# Patient Record
Sex: Female | Born: 1965 | Race: White | Hispanic: No | Marital: Married | Smoking: Former smoker
Health system: Southern US, Community
[De-identification: ages and names within clinical notes are randomized; demographics above are authoritative.]

## PROBLEM LIST (undated history)

## (undated) DIAGNOSIS — R112 Nausea with vomiting, unspecified: Secondary | ICD-10-CM

## (undated) DIAGNOSIS — M1990 Unspecified osteoarthritis, unspecified site: Secondary | ICD-10-CM

## (undated) DIAGNOSIS — Z9889 Other specified postprocedural states: Secondary | ICD-10-CM

## (undated) DIAGNOSIS — F419 Anxiety disorder, unspecified: Secondary | ICD-10-CM

## (undated) DIAGNOSIS — S83207A Unspecified tear of unspecified meniscus, current injury, left knee, initial encounter: Secondary | ICD-10-CM

## (undated) DIAGNOSIS — N3281 Overactive bladder: Secondary | ICD-10-CM

## (undated) DIAGNOSIS — J302 Other seasonal allergic rhinitis: Secondary | ICD-10-CM

## (undated) DIAGNOSIS — Z973 Presence of spectacles and contact lenses: Secondary | ICD-10-CM

## (undated) DIAGNOSIS — E119 Type 2 diabetes mellitus without complications: Secondary | ICD-10-CM

---

## 1987-09-21 HISTORY — PX: TUBAL LIGATION: SHX77

## 1999-09-21 HISTORY — PX: LAPAROSCOPIC CHOLECYSTECTOMY: SUR755

## 2001-09-20 HISTORY — PX: APPENDECTOMY: SHX54

## 2003-09-21 HISTORY — PX: OTHER SURGICAL HISTORY: SHX169

## 2007-09-21 HISTORY — PX: VAGINAL HYSTERECTOMY: SUR661

## 2007-09-21 HISTORY — PX: LAPAROSCOPIC SALPINGOOPHERECTOMY: SUR795

## 2008-09-20 HISTORY — PX: CARDIAC CATHETERIZATION: SHX172

## 2009-02-12 ENCOUNTER — Encounter: Payer: Self-pay | Admitting: Emergency Medicine

## 2009-02-13 ENCOUNTER — Inpatient Hospital Stay (HOSPITAL_COMMUNITY): Admission: AD | Admit: 2009-02-13 | Discharge: 2009-02-14 | Payer: Self-pay | Admitting: Cardiology

## 2010-09-20 HISTORY — PX: ANTERIOR CERVICAL DECOMP/DISCECTOMY FUSION: SHX1161

## 2010-12-29 LAB — POCT CARDIAC MARKERS
Myoglobin, poc: 34 ng/mL (ref 12–200)
Troponin i, poc: <0.05 ng/mL (ref 0.00–0.09)
Troponin i, poc: <0.05 ng/mL (ref 0.00–0.09)

## 2010-12-29 LAB — URINE MICROSCOPIC-ADD ON

## 2010-12-29 LAB — CBC
HCT: 35.4 % — ABNORMAL LOW (ref 36.0–46.0)
Hemoglobin: 11.9 g/dL — ABNORMAL LOW (ref 12.0–15.0)
MCHC: 34.1 g/dL (ref 30.0–36.0)
MCV: 91.8 fL (ref 78.0–100.0)
Platelets: 248 10*3/uL (ref 150–400)
RBC: 3.85 MIL/uL — ABNORMAL LOW (ref 3.87–5.11)
RDW: 12.4 % (ref 11.5–15.5)
WBC: 6.3 10*3/uL (ref 4.0–10.5)
WBC: 6.5 10*3/uL (ref 4.0–10.5)

## 2010-12-29 LAB — URINALYSIS, ROUTINE W REFLEX MICROSCOPIC
Bilirubin Urine: NEGATIVE
Protein, ur: NEGATIVE mg/dL
Urobilinogen, UA: 0.2 mg/dL (ref 0.0–1.0)

## 2010-12-29 LAB — COMPREHENSIVE METABOLIC PANEL
ALT: 25 U/L (ref 0–35)
Albumin: 4 g/dL (ref 3.5–5.2)
Alkaline Phosphatase: 71 U/L (ref 39–117)
Chloride: 102 mEq/L (ref 96–112)
Potassium: 3.4 mEq/L — ABNORMAL LOW (ref 3.5–5.1)
Sodium: 137 mEq/L (ref 135–145)
Total Bilirubin: 0.6 mg/dL (ref 0.3–1.2)
Total Protein: 7.4 g/dL (ref 6.0–8.3)

## 2010-12-29 LAB — BASIC METABOLIC PANEL
BUN: 10 mg/dL (ref 6–23)
CO2: 29 mEq/L (ref 19–32)
Calcium: 8.9 mg/dL (ref 8.4–10.5)
Chloride: 106 mEq/L (ref 96–112)
Creatinine, Ser: 0.74 mg/dL (ref 0.4–1.2)
Creatinine, Ser: 0.85 mg/dL (ref 0.4–1.2)
GFR calc Af Amer: >60 mL/min (ref >60)
GFR calc Af Amer: >60 mL/min (ref >60)
GFR calc non Af Amer: >60 mL/min (ref >60)
Potassium: 3.8 mEq/L (ref 3.5–5.1)
Sodium: 139 mEq/L (ref 135–145)

## 2010-12-29 LAB — DIFFERENTIAL
Basophils Absolute: 0 10*3/uL (ref 0.0–0.1)
Basophils Relative: 1 % (ref 0–1)
Eosinophils Absolute: 0.2 10*3/uL (ref 0.0–0.7)
Eosinophils Relative: 3 % (ref 0–5)
Monocytes Absolute: 0.5 10*3/uL (ref 0.1–1.0)
Monocytes Relative: 8 % (ref 3–12)

## 2010-12-29 LAB — LIPID PANEL
HDL: 38 mg/dL — ABNORMAL LOW (ref >39)
LDL Cholesterol: 121 mg/dL — ABNORMAL HIGH (ref 0–99)
Triglycerides: 67 mg/dL (ref <150)
VLDL: 13 mg/dL (ref 0–40)

## 2010-12-29 LAB — URINE CULTURE: Colony Count: NO GROWTH

## 2010-12-29 LAB — CARDIAC PANEL(CRET KIN+CKTOT+MB+TROPI)
Relative Index: INVALID (ref 0.0–2.5)
Total CK: 59 U/L (ref 7–177)
Troponin I: <0.01 ng/mL (ref 0.00–0.06)

## 2011-02-02 NOTE — Cardiovascular Report (Signed)
NAMEANNABELLA, ELFORD                 ACCOUNT NO.:  [id number]   MEDICAL RECORD NO.:  [id number]          PATIENT TYPE:  INP   LOCATION:  2505                         FACILITY:  [facility]   PHYSICIAN:  Cristy Hilts. Jacinto Halim, MD       DATE OF BIRTH:  10/28/1965   DATE OF PROCEDURE:  02/13/2009  DATE OF DISCHARGE:                            CARDIAC CATHETERIZATION   PROCEDURE PERFORMED:  1. Left ventriculography.  2. Selective intraoperative coronary arteriography.  3. Ascending aortogram.   INDICATIONS:  Ms. Kamela Blansett is a 45 year old female who has diet-  controlled diabetes, hyperlipidemia, family history as well as previous  coronary artery disease, who was admitted via [facility] with  chest pain with radiation to her neck.  She had recurrence of this chest  discomfort.  Given her multiple cardiovascular risks and ongoing chest  discomfort with subtle EKG abnormalities with nonspecific T-wave  inversion in the inferior leads, she was directly brought to the cardiac  catheterization lab to evaluate her coronary anatomy.   HEMODYNAMIC DATA:  The left ventricular pressure was 117/5 with end-  diastolic pressure of 10 mmHg.  Her aortic pressure was 119/72 with a  mean of 94 mmHg.  There was no pressure gradient across the aortic  valve.   ANGIOGRAPHIC DATA:  Left ventricle:  Left ventricular systolic function  was normal with the ejection fraction of 60%.  There was no regional  wall motion abnormality.   Right coronary artery:  Right coronary artery is a caliber vessel and a  dominant vessel giving origin to large PDA and a smaller PDA branch and  a moderate-sized PLA branch, smooth and normal.   LAD:  LAD is a large-caliber vessel in the proximal segment giving  origin to 2 large diagonals.  It ends at the apex.  It is smooth and  normal.   Circumflex coronary artery:  Circumflex coronary artery is a moderate-  caliber vessel which is smooth and normal.   Ascending aortogram:   Ascending aortogram revealed a presence of 3  aortic valve cusps without evidence of aortic dissection or aortic  regurgitation.   IMPRESSION:  Normal coronary arteries, normal left ventricle, and normal  left ventricular hemodynamics.  No evidence of aortic dissection or  ascending aortic aneurysm.  Abdominal aorta was also screened, but was  not documented.  It appeared to be normal.  No evidence of renal artery  stenosis.   RECOMMENDATIONS:  Evaluation for noncardiac cause of chest pain is  indicated.  We will probably need to treat her for GERD and esophageal  spasm.  Given her multiple cardiovascular risks , she should be treated  with statins to get the LDL less than 100 preferably closer to 70.  She  does need weight loss and lifestyle of modification.   TECHNIQUE OF THE PROCEDURE:  Under usual sterile precautions using a 6-  French right femoral arterial access, 6-French multipurpose B2 catheter  was used and advanced into the ascending aorta and then to the left  ventricle.  Left ventriculography was performed both in LAO and RAO  projection.  Catheter pulled into the  ascending aorta.  Right coronary artery was selectively engaged and  angiography was performed.  Then, left coronary artery was selectively  engaged and angiography was performed.  Catheter was then pulled into  root of the aorta and left ascending aortogram was performed in the LAO  projection.      Cristy Hilts. Jacinto Halim, MD  Electronically Signed     JRG/MEDQ  D:  02/13/2009  T:  02/14/2009  Job:  [id number]   cc:   [facility]

## 2011-02-02 NOTE — H&P (Signed)
NAMEGRAYCIE, HALLEY NO.:  [id number]   MEDICAL RECORD NO.:  [id number]          PATIENT TYPE:  INP   LOCATION:  1429                         FACILITY:  [facility]   PHYSICIAN:  Vania Rea, M.D. DATE OF BIRTH:  [birth year]   DATE OF ADMISSION:  02/12/2009  DATE OF DISCHARGE:                              HISTORY & PHYSICAL   PRIMARY CARE PHYSICIAN:  Unassigned.   CHIEF COMPLAINT:  Chest pain.   HISTORY OF PRESENT ILLNESS:  This is a 45 year old obese Caucasian lady  who works as a IT consultant, who was at work on the afternoon of admission  when she suddenly felt cold and sweaty, then developed a heavy pressure  in her chest.  She said she felt as if there was a cinder block on her  chest.  She rates the discomfort as a 4/10, and it has radiated up into  her neck.  Patient took no medication and felt that it would ease if she  simply rested.  It lasted for about half an hour, and eventually she  came to [facility]  to be evaluated for the chest pain.   Patient has no known personal cardiac risk factors, but her father had  an acute MI at age 61, and she has many uncles and aunts on his side  with cardiac disease.  None of her siblings in her age group have heart  disease.  She does not smoke nor drink.  She does not know her lipid  status.  Patient denies any dyspnea on exertion, lower extremity edema,  or orthopnea.  She exercises off and on.  The last time she exercised  was about 2 weeks ago.  She walked 2 miles.  She said she had no chest  discomfort with that exertion.   PAST MEDICAL HISTORY:  1. Chronic back pain.  2. Seasonal allergies.   PAST SURGICAL HISTORY:  She is status post appendectomy,  cholecystectomy, hysterectomy, and tubal ligation.   MEDICATIONS:  1. Ibuprofen 800 mg 3 times daily.  2. Ibuprofen with acetaminophen 5/325 1/2 to 1 tablet 3 times daily.  3. Flonase nasal spray as needed.  4. Allegra-D 1 tablet daily.  5.  Estrogen patches 0.0375 twice weekly.   ALLERGIES:  No known drug allergies.   SOCIAL HISTORY:  Denies tobacco, alcohol, or illicit drug use.  Works as  a IT consultant.   FAMILY HISTORY:  As noted above.   REVIEW OF SYSTEMS:  On a 10-point review of systems, other than noted  above, unremarkable.   PHYSICAL EXAMINATION:  A pleasant middle-aged Caucasian lady lying on  the stretcher in no acute distress.  VITALS:  Temperature 97.9, pulse 70, respirations 18, blood pressure  117/62.  She is saturating at 100% on room air.  Pupils are round and equal.  Mucous membranes are pink and anicteric.  She is not dehydrated.  No cervical lymphadenopathy or thyromegaly.  No  carotid bruit.  No jugular venous distention.  CHEST:  Clear to auscultation bilaterally.  CARDIOVASCULAR:  Regular rhythm without murmurs, rubs or gallops.  ABDOMEN:  Obese and soft.  EXTREMITIES:  Without edema.  She has 3+ dorsalis pedis and posterior  tibial pulses bilaterally.  CENTRAL NERVOUS SYSTEM:  Cranial nerves II-XII are grossly intact.  She  has no focal neurological deficits.   LABS:  CBC is reviewed.  White count 6.5, hemoglobin 11.9, platelets  248.  She has otherwise a normal differential.  Serum chemistry is  significant for a potassium of 3.4, BUN 12, creatinine 0.77.  All other  liver functions and chemistry are completely normal.  Her coags are  completely normal.  Point-of-care markers are completely normal with  undetectable troponins and a CK-MB.  Urinalysis showed concentrated  urine with a specific gravity of 1.036, otherwise unremarkable.  Her  urine microscopy showed no white cells, no red cells, rare bacteria.   Portable chest x-ray showed no acute abnormalities.   Her EKG showed normal sinus rhythm with reported left axis deviation and  incomplete right bundle branch block and a long Q-T.   ASSESSMENT:  Significant chest pressure in a middle-aged lady with a  strong family history of  coronary artery disease.   PLAN:  Will bring this lady on observation to check serial cardiac  enzymes and check her lipid status.  Check her thyroid function.  Patient was somewhat dehydrated with low serum potassium.  She denies  diuretic use.  She has a long Q-T.  We will hydrate her and replace her  potassium.  She is anemic and has a history of significant NSAIDs use.  This could be peptic ulcer disease.  We will check her anemia panel and  withhold NSAIDs for the time being.  Other plans as per orders.      Vania Rea, M.D.  Electronically Signed     LC/MEDQ  D:  02/13/2009  T:  02/13/2009  Job:  [id number]

## 2011-02-02 NOTE — Discharge Summary (Signed)
Anna [name], Anna [name]                 ACCOUNT NO.:  [id number]   MEDICAL RECORD NO.:  [id number]          PATIENT TYPE:  INP   LOCATION:  2505                         FACILITY:  [facility]   PHYSICIAN:  Cristy Hilts. Jacinto Halim, MD       DATE OF BIRTH:  06/07/1966   DATE OF ADMISSION:  02/13/2009  DATE OF DISCHARGE:  02/14/2009                               DISCHARGE SUMMARY   DISCHARGE DIAGNOSES:  1. Chest pain worrisome for unstable angina, catheterization this      admission revealing normal coronaries and left ventricular      function.  2. Strong family history of coronary artery disease.  3. Type 2 non-insulin-dependent diabetes, diet controlled.  4. Dyslipidemia.   HOSPITAL COURSE:  The patient is a 45 year old female with no prior  history of coronary artery disease who was admitted to [facility] on Feb 12, 2009 with substernal chest pain described as  pressure associated with some mild diaphoresis.  She was admitted to the  hospitalist service.  We were asked to see her in consult on Feb 13, 2009.  The patient does have risk factors for coronary artery disease  including diet-controlled diabetes had a strong family history of  coronary artery disease, her father had an MI at 81.  Symptoms were  concerning for unstable angina.  She was seen in consult by Dr. Jacinto Halim  and it was decided to proceed with diagnostic catheterization.  This was  done on Feb 13, 2009, and revealed normal coronary arteries and LV  function.  We feel the patient could be discharged and follow up with  Dr. Jacinto Halim.  We did suggest an over-the-counter PPI therapy.   DISCHARGE MEDICATIONS:  1. Ibuprofen 800 mg t.i.d. p.r.n.  2. Oxycodone 5/325 one half tablet two to three times a day p.r.n.  3. Flonase spray p.r.n.  4. Allegra-D p.r.n.  5. Estrogen patch 0.0375 twice a week.  6. Prilosec OTC 20 mg a day.   LABS:  White count 6.3, hemoglobin 12.1, hematocrit 35.4, platelets 223.  Sodium 140, potassium 3.8,  BUN 10, creatinine 0.85.  CK-MB and troponins  were negative.  TSH is 3.19, cholesterol is 172, HDL 38, LDL 121.  Urinalysis did show some hematuria.  Urine culture showed no growth.  Liver functions were normal.  EKG shows sinus rhythm without acute  changes.  Chest x-ray shows no acute abnormalities.   DISPOSITION:  The patient was discharged in stable condition and will  follow up with Dr. Jacinto Halim in a couple of weeks.      Abelino Derrick, P.A.      Cristy Hilts. Jacinto Halim, MD  Electronically Signed    LKK/MEDQ  D:  02/14/2009  T:  02/15/2009  Job:  [id number]

## 2013-02-24 DIAGNOSIS — F339 Major depressive disorder, recurrent, unspecified: Secondary | ICD-10-CM | POA: Insufficient documentation

## 2013-11-23 DIAGNOSIS — M5124 Other intervertebral disc displacement, thoracic region: Secondary | ICD-10-CM | POA: Insufficient documentation

## 2013-11-23 DIAGNOSIS — M5106 Intervertebral disc disorders with myelopathy, lumbar region: Secondary | ICD-10-CM | POA: Insufficient documentation

## 2013-11-23 DIAGNOSIS — G4733 Obstructive sleep apnea (adult) (pediatric): Secondary | ICD-10-CM | POA: Insufficient documentation

## 2013-11-23 DIAGNOSIS — M51379 Other intervertebral disc degeneration, lumbosacral region without mention of lumbar back pain or lower extremity pain: Secondary | ICD-10-CM | POA: Insufficient documentation

## 2014-05-10 DIAGNOSIS — N3941 Urge incontinence: Secondary | ICD-10-CM | POA: Insufficient documentation

## 2014-05-10 DIAGNOSIS — N905 Atrophy of vulva: Secondary | ICD-10-CM | POA: Insufficient documentation

## 2014-05-10 DIAGNOSIS — J309 Allergic rhinitis, unspecified: Secondary | ICD-10-CM | POA: Insufficient documentation

## 2014-05-10 DIAGNOSIS — K580 Irritable bowel syndrome with diarrhea: Secondary | ICD-10-CM | POA: Insufficient documentation

## 2014-05-10 DIAGNOSIS — R3129 Other microscopic hematuria: Secondary | ICD-10-CM | POA: Insufficient documentation

## 2014-05-10 DIAGNOSIS — K219 Gastro-esophageal reflux disease without esophagitis: Secondary | ICD-10-CM | POA: Insufficient documentation

## 2014-08-20 HISTORY — PX: KNEE ARTHROSCOPY: SUR90

## 2015-07-01 ENCOUNTER — Ambulatory Visit: Payer: Self-pay | Admitting: Orthopedic Surgery

## 2015-07-01 NOTE — Progress Notes (Signed)
Preoperative surgical orders have been place into the Epic hospital system for Anna [name] on 07/01/2015, 12:25 PM  by Patrica Duel for surgery on 07-16-2015.  Preop Knee Scope orders including IV Tylenol and IV Decadron as long as there are no contraindications to the above medications. Avel Peace, PA-C

## 2015-07-10 ENCOUNTER — Encounter (HOSPITAL_BASED_OUTPATIENT_CLINIC_OR_DEPARTMENT_OTHER): Payer: Self-pay | Admitting: *Deleted

## 2015-07-10 NOTE — Progress Notes (Signed)
NPO AFTER MN.  ARRIVE AT 0700.  NEEDS HG.  

## 2015-07-15 DIAGNOSIS — S83249A Other tear of medial meniscus, current injury, unspecified knee, initial encounter: Secondary | ICD-10-CM | POA: Diagnosis present

## 2015-07-15 NOTE — H&P (Signed)
  CC- Anna [name] is a 49 y.o. female who presents with left knee pain.  HPI- . Knee Pain: Patient presents with knee pain involving the  left knee. Onset of the symptoms was several months ago. Inciting event: none known. Current symptoms include giving out, pain located medially and stiffness. Pain is aggravated by lateral movements, pivoting, rising after sitting, squatting and walking.  Patient has had prior knee problems. Evaluation to date: MRI: abnormal recurrent medial meniscus tear. Treatment to date: corticosteroid injection which was not very effective.  Past Medical History  Diagnosis Date  . Acute meniscal tear of left knee   . Seasonal allergies   . OAB (overactive bladder)   . Arthritis     NECK , KNEE  . Wears glasses   . PONV (postoperative nausea and vomiting)     Past Surgical History  Procedure Laterality Date  . Laparoscopic cholecystectomy  2001  . Tubal ligation  1989  . Appendectomy  2003  . Ligament repair left thumb  2005  . Vaginal hysterectomy  2009  . Laparoscopic salpingoopherectomy  2009  . Anterior cervical decomp/discectomy fusion  2012    C4 -- 5  . Knee arthroscopy Left Dec 2015    Prior to Admission medications   Medication Sig Start Date End Date Taking? Authorizing Provider  conjugated estrogens (PREMARIN) vaginal cream Apply 1 Applicatorful topically daily. ON ARM   Yes Historical Provider, MD  Estradiol (VAGIFEM) 10 MCG TABS vaginal tablet Place vaginally daily.   Yes Historical Provider, MD  fexofenadine-pseudoephedrine (ALLEGRA-D 24) 180-240 MG 24 hr tablet Take 1 tablet by mouth daily as needed.   Yes Historical Provider, MD  fluticasone (FLONASE) 50 MCG/ACT nasal spray Place into both nostrils as needed for allergies or rhinitis.   Yes Historical Provider, MD  Ibuprofen (ADVIL) 200 MG CAPS Take 600 mg by mouth 3 (three) times daily.   Yes Historical Provider, MD  Meth-Hyo-M Bl-Na Phos-Ph Sal (URIBEL) 118 MG CAPS Take by mouth as needed.    Yes Historical Provider, MD  mirabegron ER (MYRBETRIQ) 25 MG TB24 tablet Take 25 mg by mouth every evening.   Yes Historical Provider, MD  olopatadine (PATANOL) 0.1 % ophthalmic solution Place 1 drop into both eyes as needed for allergies.   Yes Historical Provider, MD  phentermine (ADIPEX-P) 37.5 MG tablet Take 37.5 mg by mouth daily before breakfast.   Yes Historical Provider, MD  terbinafine (LAMISIL) 250 MG tablet Take 250 mg by mouth every evening.   Yes Historical Provider, MD   KNEE EXAM antalgic gait, soft tissue tenderness over medial joint line, effusion, negative drawer sign, collateral ligaments intact  Physical Examination: General appearance - alert, well appearing, and in no distress Mental status - alert, oriented to person, place, and time Chest - clear to auscultation, no wheezes, rales or rhonchi, symmetric air entry Heart - normal rate, regular rhythm, normal S1, S2, no murmurs, rubs, clicks or gallops Abdomen - soft, nontender, nondistended, no masses or organomegaly Neurological - alert, oriented, normal speech, no focal findings or movement disorder noted    Asessment/Plan--- Left knee recurrent medial meniscal tear- - Plan left knee arthroscopy with meniscal debridement. Procedure risks and potential comps discussed with patient who elects to proceed. Goals are decreased pain and increased function with a high likelihood of achieving both

## 2015-07-16 ENCOUNTER — Encounter (HOSPITAL_BASED_OUTPATIENT_CLINIC_OR_DEPARTMENT_OTHER): Payer: Self-pay | Admitting: *Deleted

## 2015-07-16 ENCOUNTER — Ambulatory Visit (HOSPITAL_BASED_OUTPATIENT_CLINIC_OR_DEPARTMENT_OTHER): Payer: Federal, State, Local not specified - PPO | Admitting: Anesthesiology

## 2015-07-16 ENCOUNTER — Ambulatory Visit (HOSPITAL_BASED_OUTPATIENT_CLINIC_OR_DEPARTMENT_OTHER)
Admission: RE | Admit: 2015-07-16 | Discharge: 2015-07-16 | Disposition: A | Discharge status: Home / Self Care | Payer: Federal, State, Local not specified - PPO | Source: Ambulatory Visit | Attending: Orthopedic Surgery | Admitting: Orthopedic Surgery

## 2015-07-16 ENCOUNTER — Encounter (HOSPITAL_BASED_OUTPATIENT_CLINIC_OR_DEPARTMENT_OTHER)
Admission: RE | Disposition: A | Discharge status: Home / Self Care | Payer: Self-pay | Source: Ambulatory Visit | Attending: Orthopedic Surgery

## 2015-07-16 DIAGNOSIS — Z79890 Hormone replacement therapy (postmenopausal): Secondary | ICD-10-CM | POA: Diagnosis not present

## 2015-07-16 DIAGNOSIS — M94262 Chondromalacia, left knee: Secondary | ICD-10-CM | POA: Diagnosis not present

## 2015-07-16 DIAGNOSIS — M1990 Unspecified osteoarthritis, unspecified site: Secondary | ICD-10-CM | POA: Diagnosis not present

## 2015-07-16 DIAGNOSIS — S83242A Other tear of medial meniscus, current injury, left knee, initial encounter: Secondary | ICD-10-CM | POA: Insufficient documentation

## 2015-07-16 DIAGNOSIS — Z79899 Other long term (current) drug therapy: Secondary | ICD-10-CM | POA: Diagnosis not present

## 2015-07-16 DIAGNOSIS — G709 Myoneural disorder, unspecified: Secondary | ICD-10-CM | POA: Diagnosis not present

## 2015-07-16 DIAGNOSIS — Z791 Long term (current) use of non-steroidal anti-inflammatories (NSAID): Secondary | ICD-10-CM | POA: Insufficient documentation

## 2015-07-16 DIAGNOSIS — X58XXXA Exposure to other specified factors, initial encounter: Secondary | ICD-10-CM | POA: Insufficient documentation

## 2015-07-16 DIAGNOSIS — Z7951 Long term (current) use of inhaled steroids: Secondary | ICD-10-CM | POA: Insufficient documentation

## 2015-07-16 DIAGNOSIS — Z87891 Personal history of nicotine dependence: Secondary | ICD-10-CM | POA: Insufficient documentation

## 2015-07-16 DIAGNOSIS — M25562 Pain in left knee: Secondary | ICD-10-CM | POA: Diagnosis present

## 2015-07-16 DIAGNOSIS — S83249A Other tear of medial meniscus, current injury, unspecified knee, initial encounter: Secondary | ICD-10-CM | POA: Diagnosis present

## 2015-07-16 HISTORY — DX: Other specified postprocedural states: R11.2

## 2015-07-16 HISTORY — DX: Unspecified tear of unspecified meniscus, current injury, left knee, initial encounter: S83.207A

## 2015-07-16 HISTORY — DX: Overactive bladder: N32.81

## 2015-07-16 HISTORY — DX: Presence of spectacles and contact lenses: Z97.3

## 2015-07-16 HISTORY — DX: Unspecified osteoarthritis, unspecified site: M19.90

## 2015-07-16 HISTORY — DX: Other specified postprocedural states: Z98.890

## 2015-07-16 HISTORY — DX: Other seasonal allergic rhinitis: J30.2

## 2015-07-16 HISTORY — PX: KNEE ARTHROSCOPY: SHX127

## 2015-07-16 HISTORY — DX: Nausea with vomiting, unspecified: R11.2

## 2015-07-16 LAB — POCT HEMOGLOBIN-HEMACUE: HEMOGLOBIN: 12.9 g/dL (ref 12.0–15.0)

## 2015-07-16 SURGERY — ARTHROSCOPY, KNEE
Anesthesia: General | Site: Knee | Laterality: Left

## 2015-07-16 MED ORDER — ONDANSETRON HCL 4 MG/2ML IJ SOLN
INTRAMUSCULAR | Status: AC
  Filled 2015-07-16: qty 2

## 2015-07-16 MED ORDER — FENTANYL CITRATE (PF) 100 MCG/2ML IJ SOLN
INTRAMUSCULAR | Status: AC
  Filled 2015-07-16: qty 2

## 2015-07-16 MED ORDER — MIDAZOLAM HCL 5 MG/5ML IJ SOLN
INTRAMUSCULAR | Status: DC | PRN
  Administered 2015-07-16: 2 mg via INTRAVENOUS
  Administered 2015-07-16 (×2): 1 mg via INTRAVENOUS

## 2015-07-16 MED ORDER — SCOPOLAMINE 1 MG/3DAYS TD PT72
MEDICATED_PATCH | TRANSDERMAL | Status: AC
  Filled 2015-07-16: qty 1

## 2015-07-16 MED ORDER — SODIUM CHLORIDE 0.9 % IR SOLN
Status: DC | PRN
  Administered 2015-07-16: 6000 mL

## 2015-07-16 MED ORDER — SCOPOLAMINE 1 MG/3DAYS TD PT72
MEDICATED_PATCH | TRANSDERMAL | Status: DC | PRN
  Administered 2015-07-16: 1 via TRANSDERMAL

## 2015-07-16 MED ORDER — SODIUM CHLORIDE 0.9 % IV SOLN
INTRAVENOUS | Status: DC
  Filled 2015-07-16: qty 1000

## 2015-07-16 MED ORDER — MIDAZOLAM HCL 2 MG/2ML IJ SOLN
INTRAMUSCULAR | Status: AC
  Filled 2015-07-16: qty 2

## 2015-07-16 MED ORDER — KETOROLAC TROMETHAMINE 30 MG/ML IJ SOLN
INTRAMUSCULAR | Status: DC | PRN
  Administered 2015-07-16: 30 mg via INTRAVENOUS

## 2015-07-16 MED ORDER — FENTANYL CITRATE (PF) 100 MCG/2ML IJ SOLN
25.0000 ug | INTRAMUSCULAR | Status: DC | PRN
  Administered 2015-07-16: 25 ug via INTRAVENOUS
  Administered 2015-07-16: 50 ug via INTRAVENOUS
  Filled 2015-07-16: qty 1

## 2015-07-16 MED ORDER — PROPOFOL 10 MG/ML IV BOLUS
INTRAVENOUS | Status: DC | PRN
Start: 2015-07-16 — End: 2015-07-16
  Administered 2015-07-16: 50 mg via INTRAVENOUS
  Administered 2015-07-16: 200 mg via INTRAVENOUS
  Administered 2015-07-16: 50 mg via INTRAVENOUS

## 2015-07-16 MED ORDER — PROMETHAZINE HCL 25 MG/ML IJ SOLN
6.2500 mg | INTRAMUSCULAR | Status: DC | PRN
Start: 2015-07-16 — End: 2015-07-16
  Filled 2015-07-16: qty 1

## 2015-07-16 MED ORDER — ACETAMINOPHEN 10 MG/ML IV SOLN
1000.0000 mg | Freq: Once | INTRAVENOUS | Status: AC
  Administered 2015-07-16: 1000 mg via INTRAVENOUS
  Filled 2015-07-16: qty 100

## 2015-07-16 MED ORDER — PROPOFOL 500 MG/50ML IV EMUL
INTRAVENOUS | Status: DC | PRN
  Administered 2015-07-16: 200 ug/kg/min via INTRAVENOUS

## 2015-07-16 MED ORDER — LIDOCAINE HCL (CARDIAC) 20 MG/ML IV SOLN
INTRAVENOUS | Status: DC | PRN
  Administered 2015-07-16: 100 mg via INTRAVENOUS

## 2015-07-16 MED ORDER — OXYCODONE HCL 5 MG PO TABS: 5.0000 mg | ORAL_TABLET | ORAL | Status: DC | PRN

## 2015-07-16 MED ORDER — DEXAMETHASONE SODIUM PHOSPHATE 10 MG/ML IJ SOLN
10.0000 mg | Freq: Once | INTRAMUSCULAR | Status: AC
  Administered 2015-07-16: 10 mg via INTRAVENOUS
  Filled 2015-07-16: qty 1

## 2015-07-16 MED ORDER — CEFAZOLIN SODIUM-DEXTROSE 2-3 GM-% IV SOLR
INTRAVENOUS | Status: AC
  Filled 2015-07-16: qty 50

## 2015-07-16 MED ORDER — METHOCARBAMOL 500 MG PO TABS: 500.0000 mg | ORAL_TABLET | Freq: Four times a day (QID) | ORAL | Status: DC

## 2015-07-16 MED ORDER — METHOCARBAMOL 500 MG PO TABS
500.0000 mg | ORAL_TABLET | Freq: Four times a day (QID) | ORAL | Status: DC | PRN
  Administered 2015-07-16: 500 mg via ORAL
  Filled 2015-07-16: qty 1

## 2015-07-16 MED ORDER — LACTATED RINGERS IV SOLN
INTRAVENOUS | Status: DC
  Administered 2015-07-16: 08:00:00 via INTRAVENOUS
  Filled 2015-07-16: qty 1000

## 2015-07-16 MED ORDER — METHOCARBAMOL 500 MG PO TABS
ORAL_TABLET | ORAL | Status: AC
  Filled 2015-07-16: qty 1

## 2015-07-16 MED ORDER — OXYCODONE HCL 5 MG PO TABS
ORAL_TABLET | ORAL | Status: AC
  Filled 2015-07-16: qty 1

## 2015-07-16 MED ORDER — OXYCODONE HCL 5 MG PO TABS
5.0000 mg | ORAL_TABLET | ORAL | Status: DC | PRN
Start: 2015-07-16 — End: 2015-07-16
  Administered 2015-07-16: 5 mg via ORAL
  Filled 2015-07-16: qty 1

## 2015-07-16 MED ORDER — FENTANYL CITRATE (PF) 100 MCG/2ML IJ SOLN
INTRAMUSCULAR | Status: DC | PRN
  Administered 2015-07-16 (×4): 50 ug via INTRAVENOUS

## 2015-07-16 MED ORDER — BUPIVACAINE-EPINEPHRINE 0.25% -1:200000 IJ SOLN
INTRAMUSCULAR | Status: DC | PRN
  Administered 2015-07-16: 20 mL

## 2015-07-16 MED ORDER — CHLORHEXIDINE GLUCONATE 4 % EX LIQD
60.0000 mL | Freq: Once | CUTANEOUS | Status: DC
  Filled 2015-07-16: qty 60

## 2015-07-16 MED ORDER — CEFAZOLIN SODIUM-DEXTROSE 2-3 GM-% IV SOLR
2.0000 g | INTRAVENOUS | Status: AC
  Administered 2015-07-16: 2 g via INTRAVENOUS
  Filled 2015-07-16: qty 50

## 2015-07-16 MED ORDER — HYDROCODONE-ACETAMINOPHEN 5-325 MG PO TABS: 1.0000 | ORAL_TABLET | Freq: Four times a day (QID) | ORAL | Status: DC | PRN

## 2015-07-16 MED ORDER — FENTANYL CITRATE (PF) 100 MCG/2ML IJ SOLN
INTRAMUSCULAR | Status: AC
  Filled 2015-07-16: qty 4

## 2015-07-16 MED ORDER — ONDANSETRON HCL 4 MG/2ML IJ SOLN
INTRAMUSCULAR | Status: DC | PRN
  Administered 2015-07-16: 4 mg via INTRAVENOUS

## 2015-07-16 SURGICAL SUPPLY — 45 items
BANDAGE ELASTIC 6 VELCRO ST LF (GAUZE/BANDAGES/DRESSINGS) ×2 IMPLANT
BLADE 4.2CUDA (BLADE) ×2 IMPLANT
BLADE CUDA SHAVER 3.5 (BLADE) IMPLANT
BLADE CUTTER GATOR 3.5 (BLADE) IMPLANT
CANISTER SUCT LVC 12 LTR MEDI- (MISCELLANEOUS) ×2 IMPLANT
CANISTER SUCTION 2500CC (MISCELLANEOUS) IMPLANT
CLOTH BEACON ORANGE TIMEOUT ST (SAFETY) ×2 IMPLANT
CUFF TOURN SGL QUICK 34 (TOURNIQUET CUFF) ×1
CUFF TRNQT CYL 34X4X40X1 (TOURNIQUET CUFF) ×1 IMPLANT
DRAPE ARTHROSCOPY W/POUCH 114 (DRAPES) ×2 IMPLANT
DRAPE U-SHAPE 47X51 STRL (DRAPES) ×2 IMPLANT
DRSG EMULSION OIL 3X3 NADH (GAUZE/BANDAGES/DRESSINGS) ×2 IMPLANT
DRSG PAD ABDOMINAL 8X10 ST (GAUZE/BANDAGES/DRESSINGS) ×2 IMPLANT
DURAPREP 26ML APPLICATOR (WOUND CARE) ×2 IMPLANT
ELECT MENISCUS 165MM 90D (ELECTRODE) IMPLANT
ELECT REM PT RETURN 9FT ADLT (ELECTROSURGICAL)
ELECTRODE REM PT RTRN 9FT ADLT (ELECTROSURGICAL) IMPLANT
GLOVE BIO SURGEON STRL SZ 6 (GLOVE) ×4 IMPLANT
GLOVE BIO SURGEON STRL SZ7.5 (GLOVE) ×2 IMPLANT
GLOVE BIO SURGEON STRL SZ8 (GLOVE) ×2 IMPLANT
GLOVE INDICATOR 8.0 STRL GRN (GLOVE) ×2 IMPLANT
GOWN STRL REUS W/ TWL LRG LVL3 (GOWN DISPOSABLE) ×2 IMPLANT
GOWN STRL REUS W/TWL LRG LVL3 (GOWN DISPOSABLE) ×2
IV NS IRRIG 3000ML ARTHROMATIC (IV SOLUTION) ×6 IMPLANT
KIT ROOM TURNOVER WOR (KITS) ×2 IMPLANT
KNEE WRAP E Z 3 GEL PACK (MISCELLANEOUS) ×2 IMPLANT
MANIFOLD NEPTUNE II (INSTRUMENTS) IMPLANT
PACK ARTHROSCOPY DSU (CUSTOM PROCEDURE TRAY) ×2 IMPLANT
PACK BASIN DAY SURGERY FS (CUSTOM PROCEDURE TRAY) ×2 IMPLANT
PADDING CAST ABS 4INX4YD NS (CAST SUPPLIES) ×1
PADDING CAST ABS 6INX4YD NS (CAST SUPPLIES) ×1
PADDING CAST ABS COTTON 4X4 ST (CAST SUPPLIES) ×1 IMPLANT
PADDING CAST ABS COTTON 6X4 NS (CAST SUPPLIES) ×1 IMPLANT
PADDING CAST COTTON 6X4 STRL (CAST SUPPLIES) ×2 IMPLANT
PENCIL BUTTON HOLSTER BLD 10FT (ELECTRODE) IMPLANT
SET ARTHROSCOPY TUBING (MISCELLANEOUS) ×1
SET ARTHROSCOPY TUBING LN (MISCELLANEOUS) ×1 IMPLANT
SPONGE GAUZE 4X4 12PLY (GAUZE/BANDAGES/DRESSINGS) ×2 IMPLANT
SPONGE GAUZE 4X4 12PLY STER LF (GAUZE/BANDAGES/DRESSINGS) ×2 IMPLANT
SUT ETHILON 4 0 PS 2 18 (SUTURE) ×2 IMPLANT
TOWEL OR 17X24 6PK STRL BLUE (TOWEL DISPOSABLE) ×2 IMPLANT
TUBE CONNECTING 12X1/4 (SUCTIONS) IMPLANT
WAND 30 DEG SABER W/CORD (SURGICAL WAND) IMPLANT
WAND 90 DEG TURBOVAC W/CORD (SURGICAL WAND) ×2 IMPLANT
WATER STERILE IRR 500ML POUR (IV SOLUTION) ×2 IMPLANT

## 2015-07-16 NOTE — Transfer of Care (Signed)
Last Vitals:  Filed Vitals:   07/16/15 0727  BP: 124/79  Pulse: 92  Temp: 36.9 C  Resp: 18    Immediate Anesthesia Transfer of Care Note  Patient: Anna [name]  Procedure(s) Performed: Procedure(s) (LRB): ARTHROSCOPY LEFT KNEE WITH MEDIAL MENISCAL DEBRIDEMENT (Left)  Patient Location: PACU  Anesthesia Type: General  Level of Consciousness: awake, alert  and oriented  Airway & Oxygen Therapy: Patient Spontanous Breathing and Patient connected to face mask oxygen  Post-op Assessment: Report given to PACU RN and Post -op Vital signs reviewed and stable  Post vital signs: Reviewed and stable  Complications: No apparent anesthesia complications

## 2015-07-16 NOTE — Anesthesia Procedure Notes (Signed)
Procedure Name: LMA Insertion Date/Time: 07/16/2015 8:53 AM Performed by: Norva PavlovALLAWAY, Jamiere Gulas G Pre-anesthesia Checklist: Patient identified, Emergency Drugs available, Suction available and Patient being monitored Patient Re-evaluated:Patient Re-evaluated prior to inductionOxygen Delivery Method: Circle System Utilized Preoxygenation: Pre-oxygenation with 100% oxygen Intubation Type: IV induction Ventilation: Mask ventilation without difficulty LMA: LMA inserted LMA Size: 4.0 Number of attempts: 1 Airway Equipment and Method: bite block Placement Confirmation: positive ETCO2 Tube secured with: Tape Dental Injury: Teeth and Oropharynx as per pre-operative assessment

## 2015-07-16 NOTE — Anesthesia Postprocedure Evaluation (Signed)
  Anesthesia Post-op Note  Patient: Anna [name]  Procedure(s) Performed: Procedure(s) (LRB): ARTHROSCOPY LEFT KNEE WITH MEDIAL MENISCAL DEBRIDEMENT (Left)  Patient Location: PACU  Anesthesia Type: General with TIVA  Level of Consciousness: awake and alert   Airway and Oxygen Therapy: Patient Spontanous Breathing  Post-op Pain: mild  Post-op Assessment: Post-op Vital signs reviewed, Patient's Cardiovascular Status Stable, Respiratory Function Stable, Patent Airway and No signs of Nausea or vomiting  Last Vitals:  Filed Vitals:   07/16/15 1045  BP: 105/56  Pulse: 71  Temp:   Resp: 14    Post-op Vital Signs: stable   Complications: No apparent anesthesia complications

## 2015-07-16 NOTE — Interval H&P Note (Signed)
History and Physical Interval Note:  07/16/2015 8:38 AM  Anna [name]  has presented today for surgery, with the diagnosis of left knee medial menical tear  The various methods of treatment have been discussed with the patient and family. After consideration of risks, benefits and other options for treatment, the patient has consented to  Procedure(s): ARTHROSCOPY LEFT KNEE WITH DEBRIDEMENT (Left) as a surgical intervention .  The patient's history has been reviewed, patient examined, no change in status, stable for surgery.  I have reviewed the patient's chart and labs.  Questions were answered to the patient's satisfaction.     Loanne DrillingALUISIO,Jameela Michna V

## 2015-07-16 NOTE — Anesthesia Preprocedure Evaluation (Signed)
Anesthesia Evaluation  Patient identified by MRN, date of birth, ID band Patient awake    Reviewed: Allergy & Precautions, NPO status , Patient's Chart, lab work & pertinent test results  History of Anesthesia Complications (+) PONV and history of anesthetic complications  Airway Mallampati: II  TM Distance: >3 FB Neck ROM: Full    Dental no notable dental hx.    Pulmonary former smoker,    Pulmonary exam normal breath sounds clear to auscultation       Cardiovascular negative cardio ROS Normal cardiovascular exam Rhythm:Regular Rate:Normal     Neuro/Psych  Neuromuscular disease negative psych ROS   GI/Hepatic negative GI ROS, Neg liver ROS,   Endo/Other  negative endocrine ROS  Renal/GU negative Renal ROS  negative genitourinary   Musculoskeletal  (+) Arthritis ,   Abdominal   Peds negative pediatric ROS (+)  Hematology negative hematology ROS (+)   Anesthesia Other Findings   Reproductive/Obstetrics negative OB ROS                             Anesthesia Physical Anesthesia Plan  ASA: II  Anesthesia Plan: General   Post-op Pain Management:    Induction: Intravenous  Airway Management Planned: LMA  Additional Equipment:   Intra-op Plan:   Post-operative Plan: Extubation in OR  Informed Consent: I have reviewed the patients History and Physical, chart, labs and discussed the procedure including the risks, benefits and alternatives for the proposed anesthesia with the patient or authorized representative who has indicated his/her understanding and acceptance.   Dental advisory given  Plan Discussed with: CRNA  Anesthesia Plan Comments:         Anesthesia Quick Evaluation

## 2015-07-16 NOTE — Op Note (Signed)
Preoperative diagnosis-  Left knee medial meniscal tear  Postoperative diagnosis Left- knee medial meniscal tear   Procedure- Left knee arthroscopy with medial  meniscal debridement    Surgeon- Gus RankinFrank V. Gentry Seeber, MD  Anesthesia-General  EBL-  Minimal  Complications- None  Condition- PACU - hemodynamically stable.  Brief clinical note- -Anna [name] is a 49 y.o.  female with a several month history of left knee pain and mechanical symptoms. Exam and history suggested recurrent medial meniscal tear confirmed by MRI. The patient presents now for arthroscopy and debridement   Procedure in detail -       After successful administration of General anesthetic, a tourmiquet is placed high on the Left  thigh and the Left lower extremity is prepped and draped in the usual sterile fashion. Time out is performed by the surgical team. Standard superomedial and inferolateral portal sites are marked and incisions made with an 11 blade. The inflow cannula is passed through the superomedial portal and camera through the inferolateral portal and inflow is initiated. Arthroscopic visualization proceeds.      The undersurface of the patella and trochlea are visualized and normal. The medial and lateral gutters are visualized and there are  no loose bodies. Flexion and valgus force is applied to the knee and the medial compartment is entered. A spinal needle is passed into the joint through the site marked for the inferomedial portal. A small incision is made and the dilator passed into the joint. The findings for the medial compartment are tear with small flap at junction of body and posterior horn of medial meniscus.There is mild chondromalacia but no unstable chondral defects . The tear is debrided to a stable base with baskets and a shaver and sealed off with the Arthrocare. It is probed and found to be stable.    The intercondylar notch is visualized and the ACL appears normal. The lateral compartment is  entered and the findings are normal .     The joint is again inspected and there are no other tears, defects or loose bodies identified. The arthroscopic equipment is then removed from the inferior portals which are closed with interrupted 4-0 nylon. 20 ml of .25% Marcaine with epinephrine are injected through the inflow cannula and the cannula is then removed and the portal closed with nylon. The incisions are cleaned and dried and a bulky sterile dressing is applied. The patient is then awakened and transported to recovery in stable condition.   07/16/2015, 9:30 AM

## 2015-07-16 NOTE — Discharge Instructions (Signed)
° °Dr. Frank Aluisio °Total Joint Specialist °[facility] °[address] °[place], [place] [address] °[phone] ° ° °Arthroscopic Procedure, Knee °An arthroscopic procedure can find what is wrong with your knee. °PROCEDURE °Arthroscopy is a surgical technique that allows your orthopedic surgeon to diagnose and treat your knee injury with accuracy. They will look into your knee through a small instrument. This is almost like a small (pencil sized) telescope. Because arthroscopy affects your knee less than open knee surgery, you can anticipate a more rapid recovery. Taking an active role by following your caregiver's instructions will help with rapid and complete recovery. Use crutches, rest, elevation, ice, and knee exercises as instructed. The length of recovery depends on various factors including type of injury, age, physical condition, medical conditions, and your rehabilitation. °Your knee is the joint between the large bones (femur and tibia) in your leg. Cartilage covers these bone ends which are smooth and slippery and allow your knee to bend and move smoothly. Two menisci, thick, semi-lunar shaped pads of cartilage which form a rim inside the joint, help absorb shock and stabilize your knee. Ligaments bind the bones together and support your knee joint. Muscles move the joint, help support your knee, and take stress off the joint itself. Because of this all programs and physical therapy to rehabilitate an injured or repaired knee require rebuilding and strengthening your muscles. °AFTER THE PROCEDURE °· After the procedure, you will be moved to a recovery area until most of the effects of the medication have worn off. Your caregiver will discuss the test results with you.  °· Only take over-the-counter or prescription medicines for pain, discomfort, or fever as directed by your caregiver.  °SEEK MEDICAL CARE IF:  °· You have increased bleeding from your wounds.  °· You see  redness, swelling, or have increasing pain in your wounds.  °· You have pus coming from your wound.  °· You have an oral temperature above 102° F (38.9° C).  °· You notice a bad smell coming from the wound or dressing.  °· You have severe pain with any motion of your knee.  °SEEK IMMEDIATE MEDICAL CARE IF:  °· You develop a rash.  °· You have difficulty breathing.  °· You have any allergic problems.  °FURTHER INSTRUCTIONS:  °· ICE to the affected knee every three hours for 30 minutes at a time and then as needed for pain and swelling.  Continue to use ice on the knee for pain and swelling from surgery. You may notice swelling that will progress down to the foot and ankle.  This is normal after surgery.  Elevate the leg when you are not up walking on it.   ° °DIET °You may resume your previous home diet once your are discharged from the hospital. ° °DRESSING / WOUND CARE / SHOWERING °You may start showering two days after being discharged home but do not submerge the incisions under water.  °Change dressing 48 hours after the procedure and then cover the small incisions with band aids until your follow up visit. °Change the surgical dressings daily and reapply a dry dressing each time.  ° °ACTIVITY °Walk with your walker as instructed. °Use walker as long as suggested by your caregivers. °Avoid periods of inactivity such as sitting longer than an hour when not asleep. This helps prevent blood clots.  °You may resume a sexual relationship in one month or when given the OK by your doctor.  °You may return to   work once you are cleared by your doctor.  °Do not drive a car for 6 weeks or until released by you surgeon.  °Do not drive while taking narcotics. ° °WEIGHT BEARING AS TOLERATED ° °POSTOPERATIVE CONSTIPATION PROTOCOL °Constipation - defined medically as fewer than three stools per week and severe constipation as less than one stool per week. ° °One of the most common issues patients have following surgery is  constipation.  Even if you have a regular bowel pattern at home, your normal regimen is likely to be disrupted due to multiple reasons following surgery.  Combination of anesthesia, postoperative narcotics, change in appetite and fluid intake all can affect your bowels.  In order to avoid complications following surgery, here are some recommendations in order to help you during your recovery period. ° °Colace (docusate) - Pick up an over-the-counter form of Colace or another stool softener and take twice a day as long as you are requiring postoperative pain medications.  Take with a full glass of water daily.  If you experience loose stools or diarrhea, hold the colace until you stool forms back up.  If your symptoms do not get better within 1 week or if they get worse, check with your doctor. ° °Dulcolax (bisacodyl) - Pick up over-the-counter and take as directed by the product packaging as needed to assist with the movement of your bowels.  Take with a full glass of water.  Use this product as needed if not relieved by Colace only.  ° °MiraLax (polyethylene glycol) - Pick up over-the-counter to have on hand.  MiraLax is a solution that will increase the amount of water in your bowels to assist with bowel movements.  Take as directed and can mix with a glass of water, juice, soda, coffee, or tea.  Take if you go more than two days without a movement. °Do not use MiraLax more than once per day. Call your doctor if you are still constipated or irregular after using this medication for 7 days in a row. ° °If you continue to have problems with postoperative constipation, please contact the office for further assistance and recommendations.  If you experience "the worst abdominal pain ever" or develop nausea or vomiting, please contact the office immediatly for further recommendations for treatment. ° °ITCHING ° If you experience itching with your medications, try taking only a single pain pill, or even half a pain pill  at a time.  You can also use Benadryl over the counter for itching or also to help with sleep.  ° °TED HOSE STOCKINGS °Wear the elastic stockings on both legs for three weeks following surgery during the day but you may remove then at night for sleeping. ° °MEDICATIONS °See your medication summary on the “After Visit Summary” that the nursing staff will review with you prior to discharge.  You may have some home medications which will be placed on hold until you complete the course of blood thinner medication.  It is important for you to complete the blood thinner medication as prescribed by your surgeon.  Continue your approved medications as instructed at time of discharge. °Do not drive while taking narcotics.  ° °PRECAUTIONS °If you experience chest pain or shortness of breath - call 911 immediately for transfer to the hospital emergency department.  °If you develop a fever greater that 101 F, purulent drainage from wound, increased redness or drainage from wound, foul odor from the wound/dressing, or calf pain - CONTACT YOUR SURGEON.   °                                                °  FOLLOW-UP APPOINTMENTS °Make sure you keep all of your appointments after your operation with your surgeon and caregivers. You should call the office at [phone]  and make an appointment for approximately one week after the date of your surgery or on the date instructed by your surgeon outlined in the "After Visit Summary". ° °RANGE OF MOTION AND STRENGTHENING EXERCISES  °Rehabilitation of the knee is important following a knee injury or an operation. After just a few days of immobilization, the muscles of the thigh which control the knee become weakened and shrink (atrophy). Knee exercises are designed to build up the tone and strength of the thigh muscles and to improve knee motion. Often times heat used for twenty to thirty minutes before working out will loosen up your tissues and help with improving the range of motion  but do not use heat for the first two weeks following surgery. These exercises can be done on a training (exercise) mat, on the floor, on a table or on a bed. Use what ever works the best and is most comfortable for you Knee exercises include: ° °QUAD STRENGTHENING EXERCISES °Strengthening Quadriceps Sets ° °Tighten muscles on top of thigh by pushing knees down into floor or table. °Hold for 20 seconds. Repeat 10 times. °Do 2 sessions per day. ° ° ° ° °Strengthening Terminal Knee Extension ° °With knee bent over bolster, straighten knee by tightening muscle on top of thigh. Be sure to keep bottom of knee on bolster. °Hold for 20 seconds. Repeat 10 times. °Do 2 sessions per day. ° ° °Straight Leg with Bent Knee ° °Lie on back with opposite leg bent. Keep involved knee slightly bent at knee and raise leg 4-6". Hold for 10 seconds. °Repeat 20 times per set. °Do 2 sets per session. °Do 2 sessions per day. ° ° °Post Anesthesia Home Care Instructions ° °Activity: °Get plenty of rest for the remainder of the day. A responsible adult should stay with you for 24 hours following the procedure.  °For the next 24 hours, DO NOT: °-Drive a car °-Operate machinery °-Drink alcoholic beverages °-Take any medication unless instructed by your physician °-Make any legal decisions or sign important papers. ° °Meals: °Start with liquid foods such as gelatin or soup. Progress to regular foods as tolerated. Avoid greasy, spicy, heavy foods. If nausea and/or vomiting occur, drink only clear liquids until the nausea and/or vomiting subsides. Call your physician if vomiting continues. ° °Special Instructions/Symptoms: °Your throat may feel dry or sore from the anesthesia or the breathing tube placed in your throat during surgery. If this causes discomfort, gargle with warm salt water. The discomfort should disappear within 24 hours. ° °If you had a scopolamine patch placed behind your ear for the management of post- operative nausea and/or  vomiting: ° °1. The medication in the patch is effective for 72 hours, after which it should be removed.  Wrap patch in a tissue and discard in the trash. Wash hands thoroughly with soap and water. °2. You may remove the patch earlier than 72 hours if you experience unpleasant side effects which may include dry mouth, dizziness or visual disturbances. °3. Avoid touching the patch. Wash your hands with soap and water after contact with the patch. °  ° °

## 2015-07-17 ENCOUNTER — Encounter (HOSPITAL_BASED_OUTPATIENT_CLINIC_OR_DEPARTMENT_OTHER): Payer: Self-pay | Admitting: Orthopedic Surgery

## 2016-05-06 DIAGNOSIS — B351 Tinea unguium: Secondary | ICD-10-CM | POA: Insufficient documentation

## 2018-07-17 ENCOUNTER — Other Ambulatory Visit: Payer: Self-pay | Admitting: Obstetrics and Gynecology

## 2018-07-17 DIAGNOSIS — R928 Other abnormal and inconclusive findings on diagnostic imaging of breast: Secondary | ICD-10-CM

## 2018-07-20 ENCOUNTER — Ambulatory Visit
Admission: RE | Admit: 2018-07-20 | Discharge: 2018-07-20 | Disposition: A | Discharge status: Home / Self Care | Payer: Federal, State, Local not specified - PPO | Source: Ambulatory Visit | Attending: Obstetrics and Gynecology | Admitting: Obstetrics and Gynecology

## 2018-07-20 DIAGNOSIS — R928 Other abnormal and inconclusive findings on diagnostic imaging of breast: Secondary | ICD-10-CM

## 2018-10-23 DIAGNOSIS — G5622 Lesion of ulnar nerve, left upper limb: Secondary | ICD-10-CM | POA: Insufficient documentation

## 2018-10-23 DIAGNOSIS — Z981 Arthrodesis status: Secondary | ICD-10-CM | POA: Insufficient documentation

## 2019-06-13 IMAGING — MG DIGITAL DIAGNOSTIC UNILATERAL LEFT MAMMOGRAM WITH TOMO AND CAD
6 series · 6 of 18 positions shown · non-contrast
Comparison: Previous exam(s).

CLINICAL DATA: 52-year-old female for further evaluation of
possible LEFT breast mass on screening mammogram

EXAM:
DIGITAL DIAGNOSTIC LEFT MAMMOGRAM WITH CAD AND TOMO
ULTRASOUND LEFT BREAST

[L ML synth-2D]
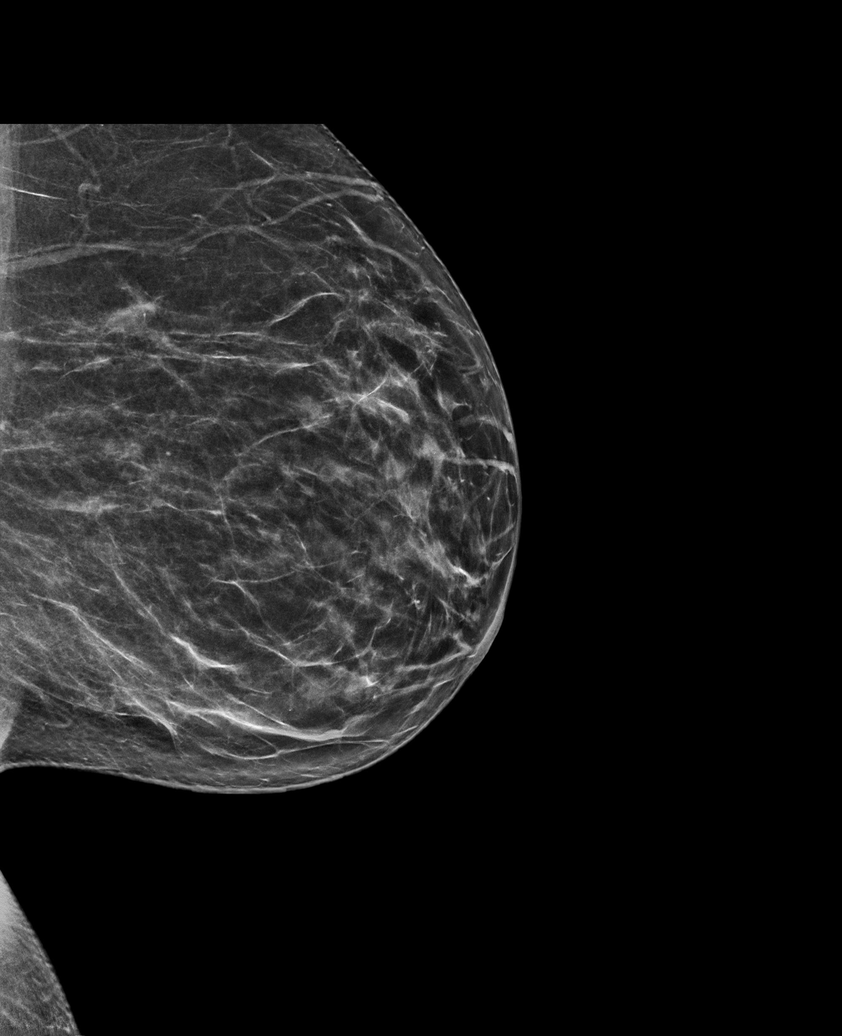

[L CC synth-2D (1 of 2)]
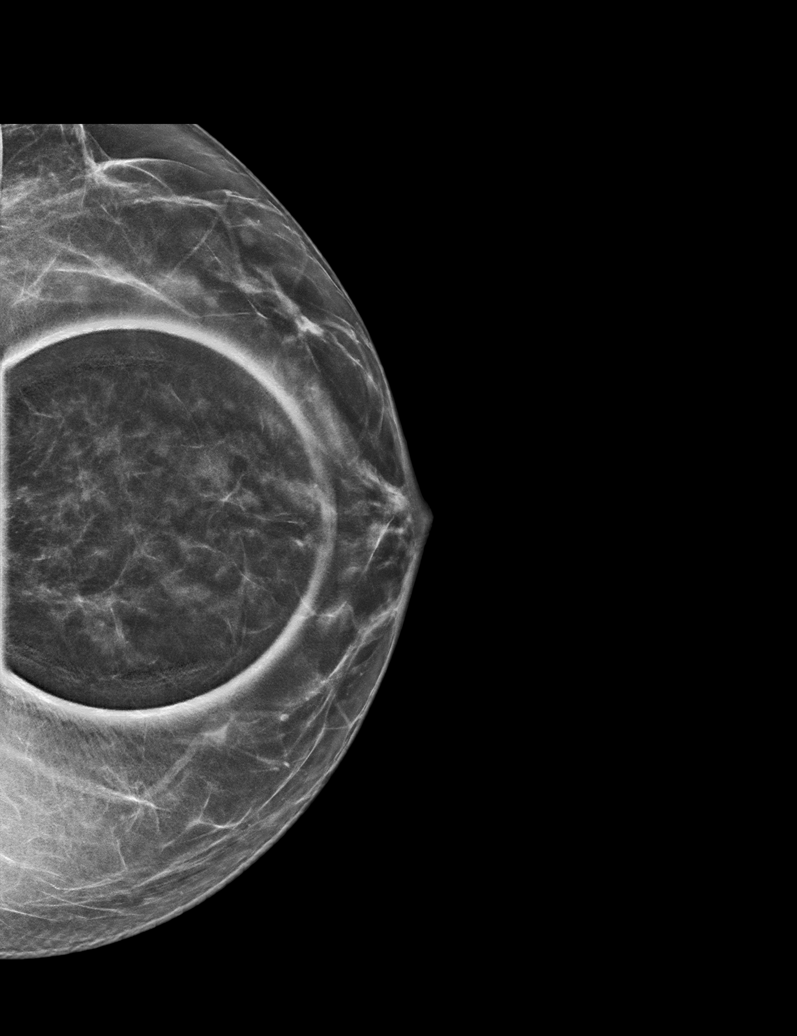

[L CC synth-2D (2 of 2)]
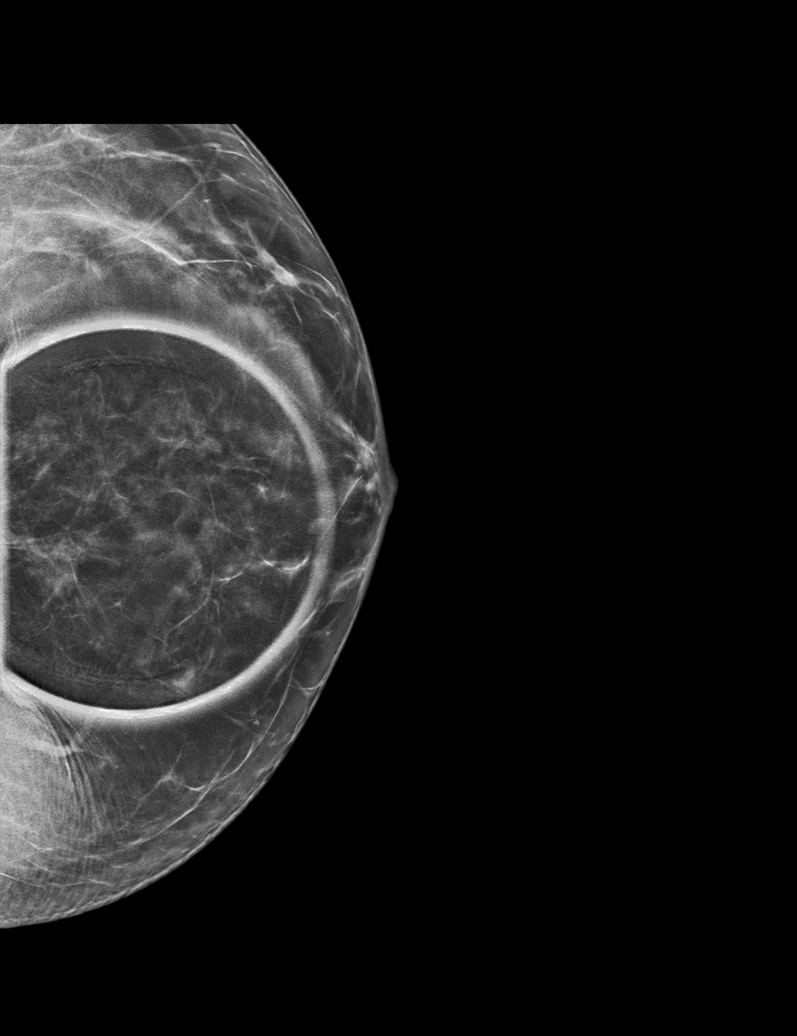

[L CC tomo (1 of 2) · tomo slice 29/56.0]
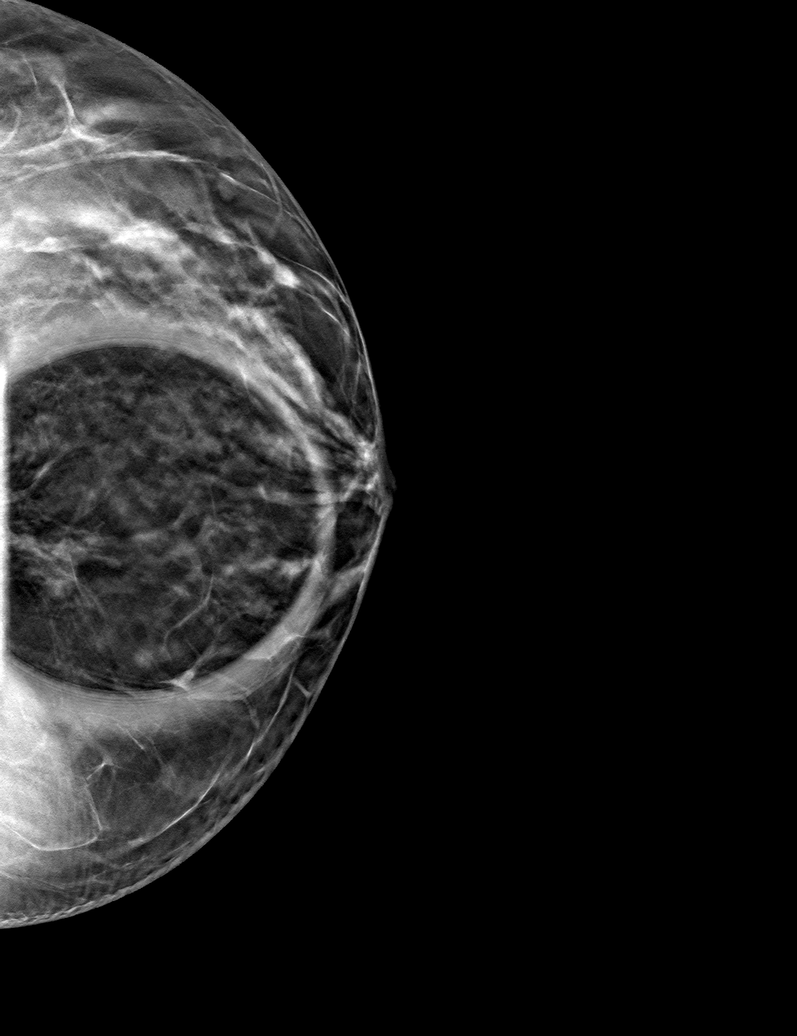

[L CC tomo (2 of 2) · tomo slice 29/57.0]
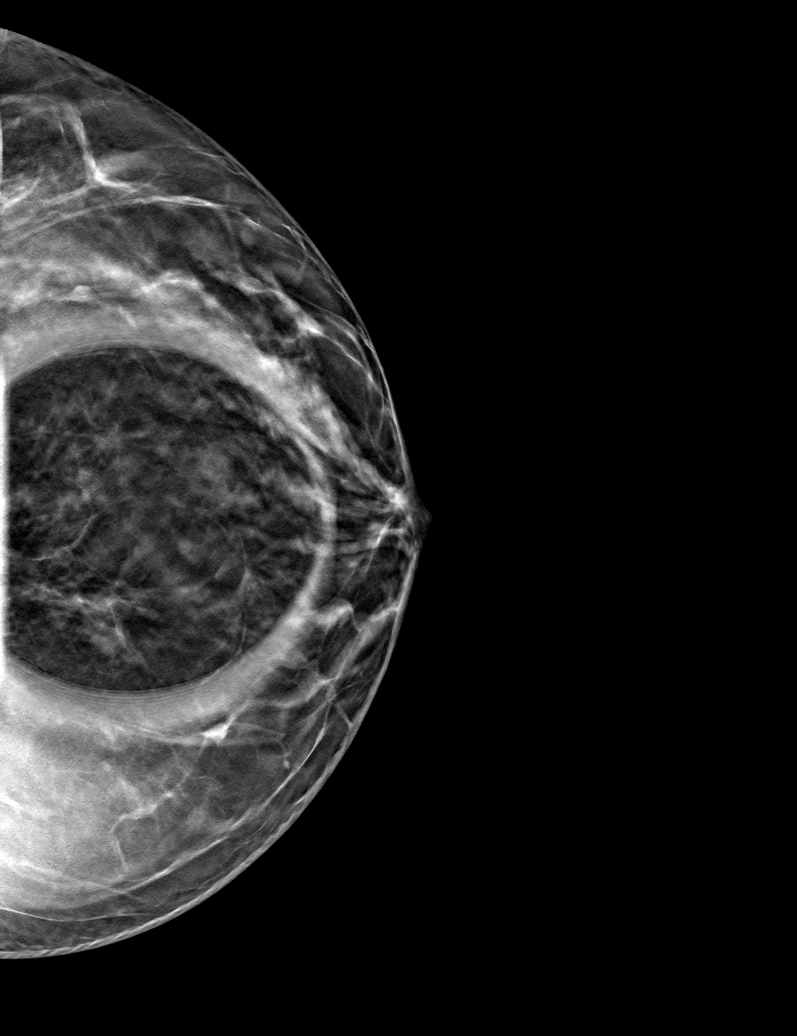

[L ML tomo · tomo slice 36/71.0]
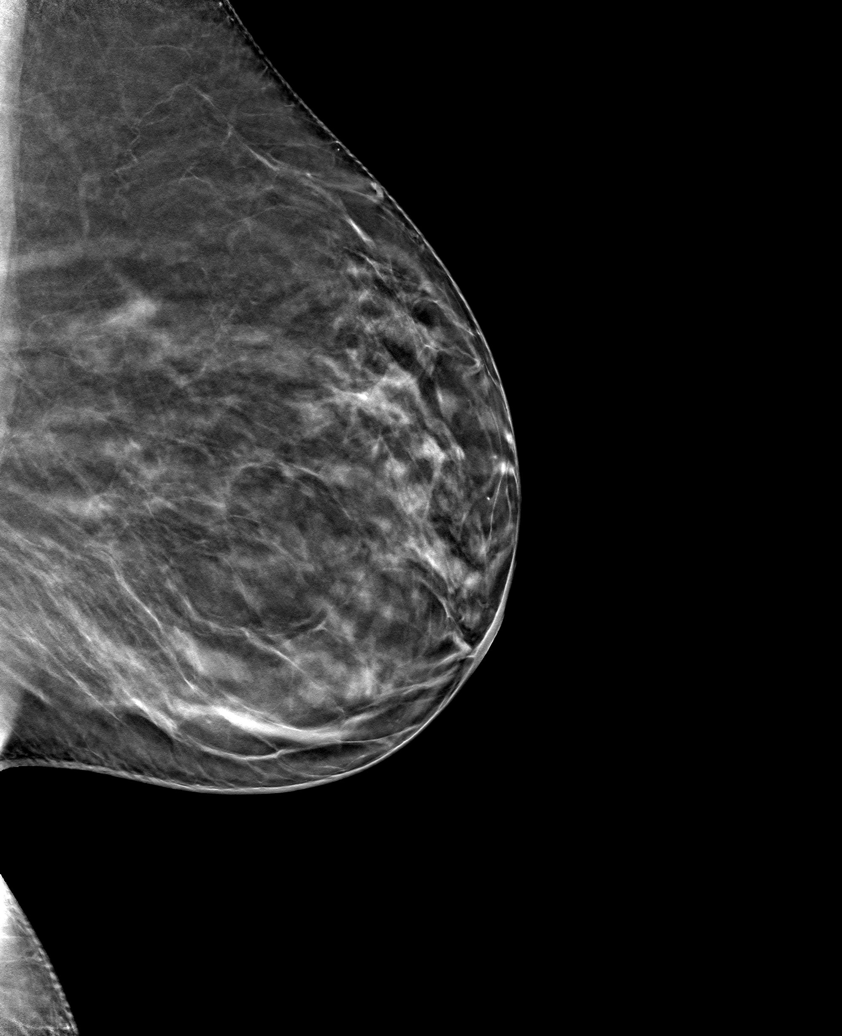

[6 of 18 positions shown; findings below may reference images not displayed]

ACR Breast Density Category b: There are scattered areas of
fibroglandular density.
FINDINGS: 2D/3D full field and spot compression views of the LEFT breast
demonstrate a persistent circumscribed oval mass within the INNER
LEFT breast.

Mammographic images were processed with CAD.

Targeted ultrasound is performed, showing a 1.2 x 0.3 x 0.7 cm
benign complicated cyst at the 11 o'clock position of the LEFT
breast 4 cm from the nipple, corresponding to the screening study
finding.
IMPRESSION: Benign cyst within the UPPER INNER LEFT breast corresponding to the
screening study finding.

RECOMMENDATION:
Bilateral screening mammogram in 1 year

I have discussed the findings and recommendations with the patient.
Results were also provided in writing at the conclusion of the
visit. If applicable, a reminder letter will be sent to the patient
regarding the next appointment.

BI-RADS CATEGORY  2: Benign.

## 2019-11-01 DIAGNOSIS — M5030 Other cervical disc degeneration, unspecified cervical region: Secondary | ICD-10-CM | POA: Diagnosis not present

## 2020-01-18 DIAGNOSIS — M180 Bilateral primary osteoarthritis of first carpometacarpal joints: Secondary | ICD-10-CM | POA: Diagnosis not present

## 2020-01-18 DIAGNOSIS — M189 Osteoarthritis of first carpometacarpal joint, unspecified: Secondary | ICD-10-CM | POA: Insufficient documentation

## 2020-01-18 DIAGNOSIS — M1812 Unilateral primary osteoarthritis of first carpometacarpal joint, left hand: Secondary | ICD-10-CM | POA: Diagnosis not present

## 2020-04-06 DIAGNOSIS — S61051A Open bite of right thumb without damage to nail, initial encounter: Secondary | ICD-10-CM | POA: Diagnosis not present

## 2020-05-11 DIAGNOSIS — Z20822 Contact with and (suspected) exposure to covid-19: Secondary | ICD-10-CM | POA: Diagnosis not present

## 2020-05-11 DIAGNOSIS — J3089 Other allergic rhinitis: Secondary | ICD-10-CM | POA: Diagnosis not present

## 2020-05-21 DIAGNOSIS — K580 Irritable bowel syndrome with diarrhea: Secondary | ICD-10-CM | POA: Diagnosis not present

## 2020-06-19 DIAGNOSIS — H52223 Regular astigmatism, bilateral: Secondary | ICD-10-CM | POA: Diagnosis not present

## 2020-06-19 DIAGNOSIS — E119 Type 2 diabetes mellitus without complications: Secondary | ICD-10-CM | POA: Diagnosis not present

## 2020-06-19 DIAGNOSIS — Z135 Encounter for screening for eye and ear disorders: Secondary | ICD-10-CM | POA: Diagnosis not present

## 2020-06-19 DIAGNOSIS — J3089 Other allergic rhinitis: Secondary | ICD-10-CM | POA: Diagnosis not present

## 2020-06-19 DIAGNOSIS — H40013 Open angle with borderline findings, low risk, bilateral: Secondary | ICD-10-CM | POA: Diagnosis not present

## 2020-06-25 DIAGNOSIS — Z131 Encounter for screening for diabetes mellitus: Secondary | ICD-10-CM | POA: Diagnosis not present

## 2020-06-25 DIAGNOSIS — N760 Acute vaginitis: Secondary | ICD-10-CM | POA: Diagnosis not present

## 2020-06-26 DIAGNOSIS — K580 Irritable bowel syndrome with diarrhea: Secondary | ICD-10-CM | POA: Diagnosis not present

## 2020-06-26 DIAGNOSIS — Z6833 Body mass index (BMI) 33.0-33.9, adult: Secondary | ICD-10-CM | POA: Diagnosis not present

## 2020-06-26 DIAGNOSIS — E1165 Type 2 diabetes mellitus with hyperglycemia: Secondary | ICD-10-CM | POA: Diagnosis not present

## 2020-07-17 DIAGNOSIS — Z6833 Body mass index (BMI) 33.0-33.9, adult: Secondary | ICD-10-CM | POA: Diagnosis not present

## 2020-07-17 DIAGNOSIS — E1165 Type 2 diabetes mellitus with hyperglycemia: Secondary | ICD-10-CM | POA: Diagnosis not present

## 2020-07-17 DIAGNOSIS — Z23 Encounter for immunization: Secondary | ICD-10-CM | POA: Diagnosis not present

## 2020-07-17 DIAGNOSIS — K580 Irritable bowel syndrome with diarrhea: Secondary | ICD-10-CM | POA: Diagnosis not present

## 2020-07-31 DIAGNOSIS — R21 Rash and other nonspecific skin eruption: Secondary | ICD-10-CM | POA: Diagnosis not present

## 2020-07-31 DIAGNOSIS — E119 Type 2 diabetes mellitus without complications: Secondary | ICD-10-CM | POA: Diagnosis not present

## 2020-08-28 DIAGNOSIS — Z6833 Body mass index (BMI) 33.0-33.9, adult: Secondary | ICD-10-CM | POA: Diagnosis not present

## 2020-08-28 DIAGNOSIS — K580 Irritable bowel syndrome with diarrhea: Secondary | ICD-10-CM | POA: Diagnosis not present

## 2020-08-28 DIAGNOSIS — E1165 Type 2 diabetes mellitus with hyperglycemia: Secondary | ICD-10-CM | POA: Diagnosis not present

## 2020-09-05 DIAGNOSIS — N3941 Urge incontinence: Secondary | ICD-10-CM | POA: Diagnosis not present

## 2020-09-05 DIAGNOSIS — J320 Chronic maxillary sinusitis: Secondary | ICD-10-CM | POA: Diagnosis not present

## 2020-09-05 DIAGNOSIS — J301 Allergic rhinitis due to pollen: Secondary | ICD-10-CM | POA: Diagnosis not present

## 2020-09-05 DIAGNOSIS — N952 Postmenopausal atrophic vaginitis: Secondary | ICD-10-CM | POA: Diagnosis not present

## 2020-09-15 DIAGNOSIS — Z135 Encounter for screening for eye and ear disorders: Secondary | ICD-10-CM | POA: Diagnosis not present

## 2020-09-15 DIAGNOSIS — H40013 Open angle with borderline findings, low risk, bilateral: Secondary | ICD-10-CM | POA: Diagnosis not present

## 2020-09-15 DIAGNOSIS — H52223 Regular astigmatism, bilateral: Secondary | ICD-10-CM | POA: Diagnosis not present

## 2020-09-15 DIAGNOSIS — E119 Type 2 diabetes mellitus without complications: Secondary | ICD-10-CM | POA: Diagnosis not present

## 2020-09-17 DIAGNOSIS — Z6832 Body mass index (BMI) 32.0-32.9, adult: Secondary | ICD-10-CM | POA: Diagnosis not present

## 2020-09-17 DIAGNOSIS — Z13820 Encounter for screening for osteoporosis: Secondary | ICD-10-CM | POA: Diagnosis not present

## 2020-09-17 DIAGNOSIS — Z01419 Encounter for gynecological examination (general) (routine) without abnormal findings: Secondary | ICD-10-CM | POA: Diagnosis not present

## 2020-09-23 DIAGNOSIS — J3089 Other allergic rhinitis: Secondary | ICD-10-CM | POA: Diagnosis not present

## 2020-10-08 DIAGNOSIS — J320 Chronic maxillary sinusitis: Secondary | ICD-10-CM | POA: Diagnosis not present

## 2020-10-08 DIAGNOSIS — J329 Chronic sinusitis, unspecified: Secondary | ICD-10-CM | POA: Diagnosis not present

## 2020-10-24 DIAGNOSIS — M5136 Other intervertebral disc degeneration, lumbar region: Secondary | ICD-10-CM | POA: Diagnosis not present

## 2020-10-24 DIAGNOSIS — R52 Pain, unspecified: Secondary | ICD-10-CM | POA: Diagnosis not present

## 2020-10-29 DIAGNOSIS — J301 Allergic rhinitis due to pollen: Secondary | ICD-10-CM | POA: Diagnosis not present

## 2020-11-03 DIAGNOSIS — M545 Low back pain, unspecified: Secondary | ICD-10-CM | POA: Diagnosis not present

## 2020-11-03 DIAGNOSIS — M6281 Muscle weakness (generalized): Secondary | ICD-10-CM | POA: Diagnosis not present

## 2020-11-03 DIAGNOSIS — M79605 Pain in left leg: Secondary | ICD-10-CM | POA: Diagnosis not present

## 2020-11-03 DIAGNOSIS — M5416 Radiculopathy, lumbar region: Secondary | ICD-10-CM | POA: Diagnosis not present

## 2020-11-06 DIAGNOSIS — M79605 Pain in left leg: Secondary | ICD-10-CM | POA: Diagnosis not present

## 2020-11-06 DIAGNOSIS — M6281 Muscle weakness (generalized): Secondary | ICD-10-CM | POA: Diagnosis not present

## 2020-11-06 DIAGNOSIS — M5416 Radiculopathy, lumbar region: Secondary | ICD-10-CM | POA: Diagnosis not present

## 2020-11-06 DIAGNOSIS — M545 Low back pain, unspecified: Secondary | ICD-10-CM | POA: Diagnosis not present

## 2020-11-13 DIAGNOSIS — M5416 Radiculopathy, lumbar region: Secondary | ICD-10-CM | POA: Diagnosis not present

## 2020-11-13 DIAGNOSIS — M545 Low back pain, unspecified: Secondary | ICD-10-CM | POA: Diagnosis not present

## 2020-11-13 DIAGNOSIS — M6281 Muscle weakness (generalized): Secondary | ICD-10-CM | POA: Diagnosis not present

## 2020-11-13 DIAGNOSIS — M79605 Pain in left leg: Secondary | ICD-10-CM | POA: Diagnosis not present

## 2020-11-14 DIAGNOSIS — M5416 Radiculopathy, lumbar region: Secondary | ICD-10-CM | POA: Diagnosis not present

## 2020-11-14 DIAGNOSIS — M6281 Muscle weakness (generalized): Secondary | ICD-10-CM | POA: Diagnosis not present

## 2020-11-14 DIAGNOSIS — M545 Low back pain, unspecified: Secondary | ICD-10-CM | POA: Diagnosis not present

## 2020-11-14 DIAGNOSIS — M79605 Pain in left leg: Secondary | ICD-10-CM | POA: Diagnosis not present

## 2020-11-17 DIAGNOSIS — M79605 Pain in left leg: Secondary | ICD-10-CM | POA: Diagnosis not present

## 2020-11-17 DIAGNOSIS — M5416 Radiculopathy, lumbar region: Secondary | ICD-10-CM | POA: Diagnosis not present

## 2020-11-17 DIAGNOSIS — M6281 Muscle weakness (generalized): Secondary | ICD-10-CM | POA: Diagnosis not present

## 2020-11-17 DIAGNOSIS — M545 Low back pain, unspecified: Secondary | ICD-10-CM | POA: Diagnosis not present

## 2020-11-21 DIAGNOSIS — M545 Low back pain, unspecified: Secondary | ICD-10-CM | POA: Diagnosis not present

## 2020-11-21 DIAGNOSIS — M5416 Radiculopathy, lumbar region: Secondary | ICD-10-CM | POA: Diagnosis not present

## 2020-11-21 DIAGNOSIS — M79605 Pain in left leg: Secondary | ICD-10-CM | POA: Diagnosis not present

## 2020-11-21 DIAGNOSIS — M6281 Muscle weakness (generalized): Secondary | ICD-10-CM | POA: Diagnosis not present

## 2020-11-25 DIAGNOSIS — M79605 Pain in left leg: Secondary | ICD-10-CM | POA: Diagnosis not present

## 2020-11-25 DIAGNOSIS — M5416 Radiculopathy, lumbar region: Secondary | ICD-10-CM | POA: Diagnosis not present

## 2020-11-25 DIAGNOSIS — M6281 Muscle weakness (generalized): Secondary | ICD-10-CM | POA: Diagnosis not present

## 2020-11-25 DIAGNOSIS — M545 Low back pain, unspecified: Secondary | ICD-10-CM | POA: Diagnosis not present

## 2020-11-28 DIAGNOSIS — M545 Low back pain, unspecified: Secondary | ICD-10-CM | POA: Diagnosis not present

## 2020-11-28 DIAGNOSIS — M6281 Muscle weakness (generalized): Secondary | ICD-10-CM | POA: Diagnosis not present

## 2020-11-28 DIAGNOSIS — M5416 Radiculopathy, lumbar region: Secondary | ICD-10-CM | POA: Diagnosis not present

## 2020-11-28 DIAGNOSIS — M79605 Pain in left leg: Secondary | ICD-10-CM | POA: Diagnosis not present

## 2020-12-12 DIAGNOSIS — Z7689 Persons encountering health services in other specified circumstances: Secondary | ICD-10-CM | POA: Diagnosis not present

## 2020-12-12 DIAGNOSIS — Z1231 Encounter for screening mammogram for malignant neoplasm of breast: Secondary | ICD-10-CM | POA: Diagnosis not present

## 2020-12-12 DIAGNOSIS — M545 Low back pain, unspecified: Secondary | ICD-10-CM | POA: Diagnosis not present

## 2020-12-17 ENCOUNTER — Other Ambulatory Visit: Payer: Self-pay | Admitting: Obstetrics and Gynecology

## 2020-12-17 DIAGNOSIS — R928 Other abnormal and inconclusive findings on diagnostic imaging of breast: Secondary | ICD-10-CM

## 2020-12-20 DIAGNOSIS — U071 COVID-19: Secondary | ICD-10-CM | POA: Diagnosis not present

## 2020-12-24 DIAGNOSIS — Z20828 Contact with and (suspected) exposure to other viral communicable diseases: Secondary | ICD-10-CM | POA: Diagnosis not present

## 2020-12-30 DIAGNOSIS — F419 Anxiety disorder, unspecified: Secondary | ICD-10-CM | POA: Diagnosis not present

## 2020-12-30 DIAGNOSIS — M5416 Radiculopathy, lumbar region: Secondary | ICD-10-CM | POA: Diagnosis not present

## 2020-12-31 DIAGNOSIS — L433 Subacute (active) lichen planus: Secondary | ICD-10-CM | POA: Diagnosis not present

## 2020-12-31 DIAGNOSIS — D485 Neoplasm of uncertain behavior of skin: Secondary | ICD-10-CM | POA: Diagnosis not present

## 2020-12-31 DIAGNOSIS — L579 Skin changes due to chronic exposure to nonionizing radiation, unspecified: Secondary | ICD-10-CM | POA: Diagnosis not present

## 2020-12-31 DIAGNOSIS — B351 Tinea unguium: Secondary | ICD-10-CM | POA: Diagnosis not present

## 2020-12-31 DIAGNOSIS — L814 Other melanin hyperpigmentation: Secondary | ICD-10-CM | POA: Diagnosis not present

## 2021-01-06 ENCOUNTER — Ambulatory Visit
Admission: RE | Admit: 2021-01-06 | Discharge: 2021-01-06 | Disposition: A | Discharge status: Home / Self Care | Payer: Federal, State, Local not specified - PPO | Source: Ambulatory Visit | Attending: Obstetrics and Gynecology | Admitting: Obstetrics and Gynecology

## 2021-01-06 ENCOUNTER — Other Ambulatory Visit: Payer: Self-pay

## 2021-01-06 ENCOUNTER — Other Ambulatory Visit: Payer: Self-pay | Admitting: Obstetrics and Gynecology

## 2021-01-06 DIAGNOSIS — N6489 Other specified disorders of breast: Secondary | ICD-10-CM

## 2021-01-06 DIAGNOSIS — R928 Other abnormal and inconclusive findings on diagnostic imaging of breast: Secondary | ICD-10-CM | POA: Diagnosis not present

## 2021-01-06 DIAGNOSIS — R922 Inconclusive mammogram: Secondary | ICD-10-CM | POA: Diagnosis not present

## 2021-01-22 DIAGNOSIS — Z6835 Body mass index (BMI) 35.0-35.9, adult: Secondary | ICD-10-CM | POA: Diagnosis not present

## 2021-01-22 DIAGNOSIS — K580 Irritable bowel syndrome with diarrhea: Secondary | ICD-10-CM | POA: Diagnosis not present

## 2021-01-22 DIAGNOSIS — M5416 Radiculopathy, lumbar region: Secondary | ICD-10-CM | POA: Diagnosis not present

## 2021-01-22 DIAGNOSIS — E1165 Type 2 diabetes mellitus with hyperglycemia: Secondary | ICD-10-CM | POA: Diagnosis not present

## 2021-03-04 DIAGNOSIS — L439 Lichen planus, unspecified: Secondary | ICD-10-CM | POA: Diagnosis not present

## 2021-03-04 DIAGNOSIS — B351 Tinea unguium: Secondary | ICD-10-CM | POA: Diagnosis not present

## 2021-03-06 DIAGNOSIS — K580 Irritable bowel syndrome with diarrhea: Secondary | ICD-10-CM | POA: Diagnosis not present

## 2021-03-06 DIAGNOSIS — Z800 Family history of malignant neoplasm of digestive organs: Secondary | ICD-10-CM | POA: Diagnosis not present

## 2021-06-04 DIAGNOSIS — K580 Irritable bowel syndrome with diarrhea: Secondary | ICD-10-CM | POA: Diagnosis not present

## 2021-06-04 DIAGNOSIS — Z6835 Body mass index (BMI) 35.0-35.9, adult: Secondary | ICD-10-CM | POA: Diagnosis not present

## 2021-06-04 DIAGNOSIS — E1129 Type 2 diabetes mellitus with other diabetic kidney complication: Secondary | ICD-10-CM | POA: Diagnosis not present

## 2021-06-04 DIAGNOSIS — E1165 Type 2 diabetes mellitus with hyperglycemia: Secondary | ICD-10-CM | POA: Diagnosis not present

## 2021-07-16 ENCOUNTER — Other Ambulatory Visit: Payer: Self-pay

## 2021-07-16 ENCOUNTER — Other Ambulatory Visit: Payer: Self-pay | Admitting: Obstetrics and Gynecology

## 2021-07-16 ENCOUNTER — Ambulatory Visit
Admission: RE | Admit: 2021-07-16 | Discharge: 2021-07-16 | Disposition: A | Discharge status: Home / Self Care | Payer: Federal, State, Local not specified - PPO | Source: Ambulatory Visit | Attending: Obstetrics and Gynecology | Admitting: Obstetrics and Gynecology

## 2021-07-16 DIAGNOSIS — R922 Inconclusive mammogram: Secondary | ICD-10-CM | POA: Diagnosis not present

## 2021-07-16 DIAGNOSIS — N6489 Other specified disorders of breast: Secondary | ICD-10-CM

## 2021-09-04 DIAGNOSIS — D72829 Elevated white blood cell count, unspecified: Secondary | ICD-10-CM | POA: Diagnosis not present

## 2021-09-04 DIAGNOSIS — E119 Type 2 diabetes mellitus without complications: Secondary | ICD-10-CM | POA: Diagnosis not present

## 2021-09-04 DIAGNOSIS — Z0001 Encounter for general adult medical examination with abnormal findings: Secondary | ICD-10-CM | POA: Diagnosis not present

## 2021-09-04 DIAGNOSIS — Z13220 Encounter for screening for lipoid disorders: Secondary | ICD-10-CM | POA: Diagnosis not present

## 2021-09-04 DIAGNOSIS — Z7984 Long term (current) use of oral hypoglycemic drugs: Secondary | ICD-10-CM | POA: Diagnosis not present

## 2021-09-07 DIAGNOSIS — D72829 Elevated white blood cell count, unspecified: Secondary | ICD-10-CM | POA: Diagnosis not present

## 2021-09-07 DIAGNOSIS — Z23 Encounter for immunization: Secondary | ICD-10-CM | POA: Diagnosis not present

## 2021-09-07 DIAGNOSIS — Z0000 Encounter for general adult medical examination without abnormal findings: Secondary | ICD-10-CM | POA: Diagnosis not present

## 2021-09-07 DIAGNOSIS — Z7984 Long term (current) use of oral hypoglycemic drugs: Secondary | ICD-10-CM | POA: Diagnosis not present

## 2021-09-07 DIAGNOSIS — E119 Type 2 diabetes mellitus without complications: Secondary | ICD-10-CM | POA: Diagnosis not present

## 2021-09-16 DIAGNOSIS — H52223 Regular astigmatism, bilateral: Secondary | ICD-10-CM | POA: Diagnosis not present

## 2021-09-16 DIAGNOSIS — Z135 Encounter for screening for eye and ear disorders: Secondary | ICD-10-CM | POA: Diagnosis not present

## 2021-09-16 DIAGNOSIS — E119 Type 2 diabetes mellitus without complications: Secondary | ICD-10-CM | POA: Diagnosis not present

## 2021-09-16 DIAGNOSIS — H40013 Open angle with borderline findings, low risk, bilateral: Secondary | ICD-10-CM | POA: Diagnosis not present

## 2021-10-05 DIAGNOSIS — Z01419 Encounter for gynecological examination (general) (routine) without abnormal findings: Secondary | ICD-10-CM | POA: Diagnosis not present

## 2021-10-12 DIAGNOSIS — J069 Acute upper respiratory infection, unspecified: Secondary | ICD-10-CM | POA: Diagnosis not present

## 2021-11-04 DIAGNOSIS — L439 Lichen planus, unspecified: Secondary | ICD-10-CM | POA: Diagnosis not present

## 2021-11-04 DIAGNOSIS — B351 Tinea unguium: Secondary | ICD-10-CM | POA: Diagnosis not present

## 2021-11-25 DIAGNOSIS — K580 Irritable bowel syndrome with diarrhea: Secondary | ICD-10-CM | POA: Diagnosis not present

## 2021-11-25 DIAGNOSIS — Z800 Family history of malignant neoplasm of digestive organs: Secondary | ICD-10-CM | POA: Diagnosis not present

## 2021-11-25 DIAGNOSIS — E1121 Type 2 diabetes mellitus with diabetic nephropathy: Secondary | ICD-10-CM | POA: Insufficient documentation

## 2021-12-10 DIAGNOSIS — M5412 Radiculopathy, cervical region: Secondary | ICD-10-CM | POA: Diagnosis not present

## 2021-12-11 DIAGNOSIS — E119 Type 2 diabetes mellitus without complications: Secondary | ICD-10-CM | POA: Diagnosis not present

## 2021-12-14 DIAGNOSIS — E119 Type 2 diabetes mellitus without complications: Secondary | ICD-10-CM | POA: Diagnosis not present

## 2021-12-14 DIAGNOSIS — F4321 Adjustment disorder with depressed mood: Secondary | ICD-10-CM | POA: Diagnosis not present

## 2021-12-14 DIAGNOSIS — D72829 Elevated white blood cell count, unspecified: Secondary | ICD-10-CM | POA: Diagnosis not present

## 2021-12-14 DIAGNOSIS — F419 Anxiety disorder, unspecified: Secondary | ICD-10-CM | POA: Diagnosis not present

## 2022-01-15 ENCOUNTER — Ambulatory Visit
Admission: RE | Admit: 2022-01-15 | Discharge: 2022-01-15 | Disposition: A | Discharge status: Home / Self Care | Payer: Federal, State, Local not specified - PPO | Source: Ambulatory Visit | Attending: Obstetrics and Gynecology | Admitting: Obstetrics and Gynecology

## 2022-01-15 ENCOUNTER — Ambulatory Visit: Payer: Federal, State, Local not specified - PPO

## 2022-01-15 DIAGNOSIS — N6489 Other specified disorders of breast: Secondary | ICD-10-CM

## 2022-01-15 DIAGNOSIS — R928 Other abnormal and inconclusive findings on diagnostic imaging of breast: Secondary | ICD-10-CM | POA: Diagnosis not present

## 2022-02-04 DIAGNOSIS — M5030 Other cervical disc degeneration, unspecified cervical region: Secondary | ICD-10-CM | POA: Diagnosis not present

## 2022-02-04 DIAGNOSIS — M5412 Radiculopathy, cervical region: Secondary | ICD-10-CM | POA: Diagnosis not present

## 2022-02-04 DIAGNOSIS — Z981 Arthrodesis status: Secondary | ICD-10-CM | POA: Diagnosis not present

## 2022-02-04 DIAGNOSIS — M5416 Radiculopathy, lumbar region: Secondary | ICD-10-CM | POA: Diagnosis not present

## 2022-02-20 DIAGNOSIS — M5412 Radiculopathy, cervical region: Secondary | ICD-10-CM | POA: Diagnosis not present

## 2022-03-30 DIAGNOSIS — M542 Cervicalgia: Secondary | ICD-10-CM | POA: Diagnosis not present

## 2022-03-30 DIAGNOSIS — E119 Type 2 diabetes mellitus without complications: Secondary | ICD-10-CM | POA: Diagnosis not present

## 2022-04-01 DIAGNOSIS — F419 Anxiety disorder, unspecified: Secondary | ICD-10-CM | POA: Diagnosis not present

## 2022-04-01 DIAGNOSIS — E669 Obesity, unspecified: Secondary | ICD-10-CM | POA: Diagnosis not present

## 2022-04-01 DIAGNOSIS — E119 Type 2 diabetes mellitus without complications: Secondary | ICD-10-CM | POA: Diagnosis not present

## 2022-04-08 DIAGNOSIS — M5412 Radiculopathy, cervical region: Secondary | ICD-10-CM | POA: Diagnosis not present

## 2022-04-16 DIAGNOSIS — M542 Cervicalgia: Secondary | ICD-10-CM | POA: Diagnosis not present

## 2022-04-20 ENCOUNTER — Ambulatory Visit: Payer: Self-pay | Admitting: Orthopedic Surgery

## 2022-05-06 DIAGNOSIS — J387 Other diseases of larynx: Secondary | ICD-10-CM | POA: Diagnosis not present

## 2022-05-11 NOTE — Pre-Procedure Instructions (Signed)
Surgical Instructions    Your procedure is scheduled on Monday August 28.  Report to [facility] Main Entrance "A" at 12:00 P.M., then check in with the Admitting office.  Call this number if you have problems the morning of surgery:  [phone]   If you have any questions prior to your surgery date call [phone]: Open Monday-Friday 8am-4pm    Remember:  Do not eat after midnight the night before your surgery  You may drink clear liquids until 11:00 the morning of your surgery.   Clear liquids allowed are: Water, Non-Citrus Juices (without pulp), Carbonated Beverages, Clear Tea, Black Coffee ONLY (NO MILK, CREAM OR POWDERED CREAMER of any kind), and Gatorade    Take these medicines the morning of surgery with A SIP OF WATER:   busPIRone (BUSPAR) colestipol (COLESTID) MYRBETRIQ   If Needed: fexofenadine (ALLEGRA)  traMADol (ULTRAM)  Eye drops Nasal spray  As of today, STOP taking any Aspirin (unless otherwise instructed by your surgeon) Aleve, Naproxen, Ibuprofen, Motrin, Advil, Goody's, BC's, all herbal medications, fish oil, and all vitamins.           Do not wear jewelry or makeup. Do not wear lotions, powders, perfumes or deodorant. Do not shave 48 hours prior to surgery. Do not bring valuables to the hospital. Do not wear nail polish, gel polish, artificial nails, or any other type of covering on natural nails (fingers and toes) If you have artificial nails or gel coating that need to be removed by a nail salon, please have this removed prior to surgery. Artificial nails or gel coating may interfere with anesthesia's ability to adequately monitor your vital signs.  [facility] is not responsible for any belongings or valuables.    Do NOT Smoke (Tobacco/Vaping)  24 hours prior to your procedure  If you use a CPAP at night, you may bring your mask for your overnight stay.   Contacts, glasses, hearing aids, dentures or partials may not be worn into surgery, please  bring cases for these belongings   For patients admitted to the hospital, discharge time will be determined by your treatment team.   Patients discharged the day of surgery will not be allowed to drive home, and someone needs to stay with them for 24 hours.   SURGICAL WAITING ROOM VISITATION Patients having surgery or a procedure may have no more than 2 support people in the waiting area - these visitors may rotate.   Children under the age of 59 must have an adult with them who is not the patient. If the patient needs to stay at the hospital during part of their recovery, the visitor guidelines for inpatient rooms apply. Pre-op nurse will coordinate an appropriate time for 1 support person to accompany patient in pre-op.  This support person may not rotate.   Please refer to the [facility] website for the visitor guidelines for Inpatients (after your surgery is over and you are in a regular room).    Special instructions:    Oral Hygiene is also important to reduce your risk of infection.  Remember - BRUSH YOUR TEETH THE MORNING OF SURGERY WITH YOUR REGULAR TOOTHPASTE   [facility]- Preparing For Surgery  Before surgery, you can play an important role. Because skin is not sterile, your skin needs to be as free of germs as possible. You can reduce the number of germs on your skin by washing with CHG (chlorahexidine gluconate) Soap before surgery.  CHG is an antiseptic cleaner which kills  germs and bonds with the skin to continue killing germs even after washing.     Please do not use if you have an allergy to CHG or antibacterial soaps. If your skin becomes reddened/irritated stop using the CHG.  Do not shave (including legs and underarms) for at least 48 hours prior to first CHG shower. It is OK to shave your face.  Please follow these instructions carefully.     Shower the NIGHT BEFORE SURGERY and the MORNING OF SURGERY with CHG Soap.   If you chose to wash your hair, wash your  hair first as usual with your normal shampoo. After you shampoo, rinse your hair and body thoroughly to remove the shampoo.  Then [employer] and genitals (private parts) with your normal soap and rinse thoroughly to remove soap.  After that Use CHG Soap as you would any other liquid soap. You can apply CHG directly to the skin and wash gently with a scrungie or a clean washcloth.   Apply the CHG Soap to your body ONLY FROM THE NECK DOWN.  Do not use on open wounds or open sores. Avoid contact with your eyes, ears, mouth and genitals (private parts). Wash Face and genitals (private parts)  with your normal soap.   Wash thoroughly, paying special attention to the area where your surgery will be performed.  Thoroughly rinse your body with warm water from the neck down.  DO NOT shower/wash with your normal soap after using and rinsing off the CHG Soap.  Pat yourself dry with a CLEAN TOWEL.  Wear CLEAN PAJAMAS to bed the night before surgery  Place CLEAN SHEETS on your bed the night before your surgery  DO NOT SLEEP WITH PETS.   Day of Surgery:  Take a shower with CHG soap. Wear Clean/Comfortable clothing the morning of surgery Do not apply any deodorants/lotions.   Remember to brush your teeth WITH YOUR REGULAR TOOTHPASTE.    If you received a COVID test during your pre-op visit, it is requested that you wear a mask when out in public, stay away from anyone that may not be feeling well, and notify your surgeon if you develop symptoms. If you have been in contact with anyone that has tested positive in the last 10 days, please notify your surgeon.    Please read over the following fact sheets that you were given.

## 2022-05-12 ENCOUNTER — Other Ambulatory Visit: Payer: Self-pay

## 2022-05-12 ENCOUNTER — Encounter (HOSPITAL_COMMUNITY): Payer: Self-pay

## 2022-05-12 ENCOUNTER — Encounter (HOSPITAL_COMMUNITY)
Admission: RE | Admit: 2022-05-12 | Discharge: 2022-05-12 | Disposition: A | Discharge status: Home / Self Care | Payer: Federal, State, Local not specified - PPO | Source: Ambulatory Visit | Attending: Orthopedic Surgery | Admitting: Orthopedic Surgery

## 2022-05-12 VITALS — BP 116/87 | HR 98 | Temp 87.0°F | Resp 18 | Ht 66.0 in | Wt 207.9 lb

## 2022-05-12 DIAGNOSIS — Z01818 Encounter for other preprocedural examination: Secondary | ICD-10-CM | POA: Insufficient documentation

## 2022-05-12 DIAGNOSIS — E119 Type 2 diabetes mellitus without complications: Secondary | ICD-10-CM | POA: Insufficient documentation

## 2022-05-12 DIAGNOSIS — M5412 Radiculopathy, cervical region: Secondary | ICD-10-CM | POA: Diagnosis not present

## 2022-05-12 DIAGNOSIS — B351 Tinea unguium: Secondary | ICD-10-CM | POA: Diagnosis not present

## 2022-05-12 DIAGNOSIS — L439 Lichen planus, unspecified: Secondary | ICD-10-CM | POA: Diagnosis not present

## 2022-05-12 HISTORY — DX: Anxiety disorder, unspecified: F41.9

## 2022-05-12 HISTORY — DX: Type 2 diabetes mellitus without complications: E11.9

## 2022-05-12 LAB — CBC
HCT: 41.5 % (ref 36.0–46.0)
Hemoglobin: 13.5 g/dL (ref 12.0–15.0)
MCH: 29.9 pg (ref 26.0–34.0)
MCHC: 32.5 g/dL (ref 30.0–36.0)
MCV: 91.8 fL (ref 80.0–100.0)
Platelets: 274 10*3/uL (ref 150–400)
RBC: 4.52 MIL/uL (ref 3.87–5.11)
RDW: 12.1 % (ref 11.5–15.5)
WBC: 7.2 10*3/uL (ref 4.0–10.5)
nRBC: 0 % (ref 0.0–0.2)

## 2022-05-12 LAB — SURGICAL PCR SCREEN
MRSA, PCR: NEGATIVE
Staphylococcus aureus: NEGATIVE

## 2022-05-12 LAB — BASIC METABOLIC PANEL
Anion gap: 7 (ref 5–15)
BUN: 8 mg/dL (ref 6–20)
CO2: 27 mmol/L (ref 22–32)
Calcium: 9.1 mg/dL (ref 8.9–10.3)
Chloride: 106 mmol/L (ref 98–111)
Creatinine, Ser: 0.83 mg/dL (ref 0.44–1.00)
GFR, Estimated: >60 mL/min (ref >60)
Glucose, Bld: 121 mg/dL — ABNORMAL HIGH (ref 70–99)
Potassium: 4 mmol/L (ref 3.5–5.1)
Sodium: 140 mmol/L (ref 135–145)

## 2022-05-12 LAB — TYPE AND SCREEN
ABO/RH(D): A POS
Antibody Screen: NEGATIVE

## 2022-05-12 LAB — GLUCOSE, CAPILLARY: Glucose-Capillary: 129 mg/dL — ABNORMAL HIGH (ref 70–99)

## 2022-05-12 NOTE — Progress Notes (Signed)
PCP - Eulis Foster DO Cardiologist - denies  PPM/ICD - denies  EKG - pending Stress Test - denies ECHO - denies Cardiac Cath - 2010  Sleep Study - yes, no OSA  Fasting Blood Sugar - 120-130 Checks Blood Sugar 0 times a day CBG at PAT 129  ERAS Protcol -NPO    Anesthesia review: yes  Patient denies shortness of breath, fever, cough and chest pain at PAT appointment   All instructions explained to the patient, with a verbal understanding of the material. Patient agrees to go over the instructions while at home for a better understanding. Patient also instructed to self quarantine after being tested for COVID-19. The opportunity to ask questions was provided.

## 2022-05-17 ENCOUNTER — Encounter (HOSPITAL_COMMUNITY): Payer: Self-pay | Admitting: Orthopedic Surgery

## 2022-05-17 ENCOUNTER — Observation Stay (HOSPITAL_COMMUNITY)
Admission: RE | Admit: 2022-05-17 | Discharge: 2022-05-18 | Disposition: A | Discharge status: Home / Self Care | Payer: Federal, State, Local not specified - PPO | Attending: Orthopedic Surgery | Admitting: Orthopedic Surgery

## 2022-05-17 ENCOUNTER — Ambulatory Visit (HOSPITAL_COMMUNITY): Payer: Federal, State, Local not specified - PPO | Admitting: Physician Assistant

## 2022-05-17 ENCOUNTER — Other Ambulatory Visit: Payer: Self-pay

## 2022-05-17 ENCOUNTER — Ambulatory Visit (HOSPITAL_COMMUNITY): Payer: Federal, State, Local not specified - PPO

## 2022-05-17 ENCOUNTER — Ambulatory Visit (HOSPITAL_COMMUNITY)
Admission: RE | Disposition: A | Discharge status: Home / Self Care | Payer: Self-pay | Source: Home / Self Care | Attending: Orthopedic Surgery

## 2022-05-17 ENCOUNTER — Ambulatory Visit (HOSPITAL_COMMUNITY): Payer: Federal, State, Local not specified - PPO | Admitting: Anesthesiology

## 2022-05-17 DIAGNOSIS — M50121 Cervical disc disorder at C4-C5 level with radiculopathy: Secondary | ICD-10-CM | POA: Insufficient documentation

## 2022-05-17 DIAGNOSIS — Z981 Arthrodesis status: Secondary | ICD-10-CM | POA: Insufficient documentation

## 2022-05-17 DIAGNOSIS — Z79899 Other long term (current) drug therapy: Secondary | ICD-10-CM | POA: Diagnosis not present

## 2022-05-17 DIAGNOSIS — M5412 Radiculopathy, cervical region: Secondary | ICD-10-CM | POA: Diagnosis present

## 2022-05-17 DIAGNOSIS — F419 Anxiety disorder, unspecified: Secondary | ICD-10-CM | POA: Diagnosis not present

## 2022-05-17 DIAGNOSIS — M4722 Other spondylosis with radiculopathy, cervical region: Secondary | ICD-10-CM | POA: Diagnosis not present

## 2022-05-17 DIAGNOSIS — M50123 Cervical disc disorder at C6-C7 level with radiculopathy: Secondary | ICD-10-CM | POA: Diagnosis not present

## 2022-05-17 DIAGNOSIS — E669 Obesity, unspecified: Secondary | ICD-10-CM | POA: Diagnosis not present

## 2022-05-17 DIAGNOSIS — M5020 Other cervical disc displacement, unspecified cervical region: Principal | ICD-10-CM | POA: Diagnosis present

## 2022-05-17 DIAGNOSIS — E119 Type 2 diabetes mellitus without complications: Secondary | ICD-10-CM | POA: Diagnosis not present

## 2022-05-17 DIAGNOSIS — M50322 Other cervical disc degeneration at C5-C6 level: Secondary | ICD-10-CM | POA: Diagnosis not present

## 2022-05-17 HISTORY — PX: ANTERIOR CERVICAL DECOMP/DISCECTOMY FUSION: SHX1161

## 2022-05-17 LAB — GLUCOSE, CAPILLARY
Glucose-Capillary: 121 mg/dL — ABNORMAL HIGH (ref 70–99)
Glucose-Capillary: 102 mg/dL — ABNORMAL HIGH (ref 70–99)
Glucose-Capillary: 129 mg/dL — ABNORMAL HIGH (ref 70–99)
Glucose-Capillary: 167 mg/dL — ABNORMAL HIGH (ref 70–99)

## 2022-05-17 LAB — HEMOGLOBIN A1C
Hgb A1c MFr Bld: 6.5 % — ABNORMAL HIGH (ref 4.8–5.6)
Mean Plasma Glucose: 139.85 mg/dL

## 2022-05-17 LAB — ABO/RH: ABO/RH(D): A POS

## 2022-05-17 SURGERY — ANTERIOR CERVICAL DECOMPRESSION/DISCECTOMY FUSION 2 LEVEL/HARDWARE REMOVAL
Anesthesia: General | Site: Neck

## 2022-05-17 MED ORDER — ONDANSETRON HCL 4 MG/2ML IJ SOLN
INTRAMUSCULAR | Status: DC | PRN
  Administered 2022-05-17: 4 mg via INTRAVENOUS

## 2022-05-17 MED ORDER — LACTATED RINGERS IV SOLN: INTRAVENOUS | Status: DC

## 2022-05-17 MED ORDER — OXYCODONE HCL 5 MG PO TABS: 5.0000 mg | ORAL_TABLET | Freq: Once | ORAL | Status: AC | PRN

## 2022-05-17 MED ORDER — MIDAZOLAM HCL 5 MG/5ML IJ SOLN
INTRAMUSCULAR | Status: DC | PRN
  Administered 2022-05-17: 2 mg via INTRAVENOUS

## 2022-05-17 MED ORDER — TRANEXAMIC ACID-NACL 1000-0.7 MG/100ML-% IV SOLN
INTRAVENOUS | Status: AC
  Filled 2022-05-17: qty 100

## 2022-05-17 MED ORDER — OXYCODONE HCL 5 MG PO TABS
5.0000 mg | ORAL_TABLET | ORAL | Status: DC | PRN
  Administered 2022-05-18: 5 mg via ORAL
  Filled 2022-05-17: qty 1

## 2022-05-17 MED ORDER — PHENOL 1.4 % MT LIQD
1.0000 | OROMUCOSAL | Status: DC | PRN
  Administered 2022-05-18: 1 via OROMUCOSAL
  Filled 2022-05-17: qty 177

## 2022-05-17 MED ORDER — ACETAMINOPHEN 500 MG PO TABS
1000.0000 mg | ORAL_TABLET | Freq: Once | ORAL | Status: AC
  Administered 2022-05-17: 1000 mg via ORAL
  Filled 2022-05-17: qty 2

## 2022-05-17 MED ORDER — CHLORHEXIDINE GLUCONATE 0.12 % MT SOLN
15.0000 mL | Freq: Once | OROMUCOSAL | Status: AC
  Administered 2022-05-17: 15 mL via OROMUCOSAL
  Filled 2022-05-17: qty 15

## 2022-05-17 MED ORDER — FENTANYL CITRATE (PF) 250 MCG/5ML IJ SOLN
INTRAMUSCULAR | Status: DC | PRN
Start: 2022-05-17 — End: 2022-05-17
  Administered 2022-05-17 (×3): 50 ug via INTRAVENOUS

## 2022-05-17 MED ORDER — DEXAMETHASONE SODIUM PHOSPHATE 10 MG/ML IJ SOLN
INTRAMUSCULAR | Status: DC | PRN
  Administered 2022-05-17: 4 mg via INTRAVENOUS

## 2022-05-17 MED ORDER — ONDANSETRON HCL 4 MG PO TABS: 4.0000 mg | ORAL_TABLET | Freq: Four times a day (QID) | ORAL | Status: DC | PRN

## 2022-05-17 MED ORDER — SCOPOLAMINE 1 MG/3DAYS TD PT72
MEDICATED_PATCH | TRANSDERMAL | Status: AC
  Administered 2022-05-17: 1.5 mg via TRANSDERMAL
  Filled 2022-05-17: qty 1

## 2022-05-17 MED ORDER — MIDAZOLAM HCL 2 MG/2ML IJ SOLN
INTRAMUSCULAR | Status: AC
  Filled 2022-05-17: qty 2

## 2022-05-17 MED ORDER — INSULIN ASPART 100 UNIT/ML IJ SOLN: 0.0000 [IU] | INTRAMUSCULAR | Status: DC | PRN

## 2022-05-17 MED ORDER — SUGAMMADEX SODIUM 500 MG/5ML IV SOLN
INTRAVENOUS | Status: DC | PRN
  Administered 2022-05-17: 500 mg via INTRAVENOUS

## 2022-05-17 MED ORDER — PROPOFOL 10 MG/ML IV BOLUS
INTRAVENOUS | Status: AC
  Filled 2022-05-17: qty 20

## 2022-05-17 MED ORDER — PHENYLEPHRINE HCL-NACL 20-0.9 MG/250ML-% IV SOLN
INTRAVENOUS | Status: DC | PRN
  Administered 2022-05-17: 25 ug/min via INTRAVENOUS

## 2022-05-17 MED ORDER — PROPOFOL 500 MG/50ML IV EMUL
INTRAVENOUS | Status: DC | PRN
  Administered 2022-05-17: 25 ug/kg/min via INTRAVENOUS

## 2022-05-17 MED ORDER — PROMETHAZINE HCL 25 MG/ML IJ SOLN
INTRAMUSCULAR | Status: AC
  Filled 2022-05-17: qty 1

## 2022-05-17 MED ORDER — OXYCODONE HCL 5 MG PO TABS
10.0000 mg | ORAL_TABLET | ORAL | Status: DC | PRN
  Administered 2022-05-18 (×3): 10 mg via ORAL
  Filled 2022-05-17 (×3): qty 2

## 2022-05-17 MED ORDER — SODIUM CHLORIDE 0.9% FLUSH: 3.0000 mL | Freq: Two times a day (BID) | INTRAVENOUS | Status: DC

## 2022-05-17 MED ORDER — BUPIVACAINE-EPINEPHRINE (PF) 0.25% -1:200000 IJ SOLN
INTRAMUSCULAR | Status: AC
  Filled 2022-05-17: qty 30

## 2022-05-17 MED ORDER — ACETAMINOPHEN 325 MG PO TABS
650.0000 mg | ORAL_TABLET | ORAL | Status: DC | PRN
  Administered 2022-05-18: 650 mg via ORAL
  Filled 2022-05-17: qty 2

## 2022-05-17 MED ORDER — FLEET ENEMA 7-19 GM/118ML RE ENEM
1.0000 | ENEMA | Freq: Once | RECTAL | Status: DC | PRN
Start: 2022-05-17 — End: 2022-05-18

## 2022-05-17 MED ORDER — SCOPOLAMINE 1 MG/3DAYS TD PT72: 1.0000 | MEDICATED_PATCH | TRANSDERMAL | Status: DC

## 2022-05-17 MED ORDER — PROPOFOL 10 MG/ML IV BOLUS
INTRAVENOUS | Status: DC | PRN
  Administered 2022-05-17: 150 mg via INTRAVENOUS

## 2022-05-17 MED ORDER — METHOCARBAMOL 1000 MG/10ML IJ SOLN: 500.0000 mg | Freq: Four times a day (QID) | INTRAVENOUS | Status: DC | PRN

## 2022-05-17 MED ORDER — ROCURONIUM BROMIDE 10 MG/ML (PF) SYRINGE
PREFILLED_SYRINGE | INTRAVENOUS | Status: AC
Start: 2022-05-17 — End: ?
  Filled 2022-05-17: qty 10

## 2022-05-17 MED ORDER — THROMBIN 20000 UNITS EX SOLR: CUTANEOUS | Status: DC | PRN

## 2022-05-17 MED ORDER — HYDROMORPHONE HCL 1 MG/ML IJ SOLN
0.2500 mg | INTRAMUSCULAR | Status: DC | PRN
  Administered 2022-05-17 (×4): 0.5 mg via INTRAVENOUS

## 2022-05-17 MED ORDER — POLYETHYLENE GLYCOL 3350 17 G PO PACK: 17.0000 g | PACK | Freq: Every day | ORAL | Status: DC | PRN

## 2022-05-17 MED ORDER — ONDANSETRON HCL 4 MG/2ML IJ SOLN
4.0000 mg | Freq: Four times a day (QID) | INTRAMUSCULAR | Status: DC | PRN
  Administered 2022-05-18: 4 mg via INTRAVENOUS
  Filled 2022-05-17: qty 2

## 2022-05-17 MED ORDER — OXYCODONE HCL 5 MG/5ML PO SOLN
5.0000 mg | Freq: Once | ORAL | Status: AC | PRN
  Administered 2022-05-17: 5 mg via ORAL

## 2022-05-17 MED ORDER — PROMETHAZINE HCL 25 MG/ML IJ SOLN
6.2500 mg | INTRAMUSCULAR | Status: AC | PRN
  Administered 2022-05-17 (×2): 12.5 mg via INTRAVENOUS

## 2022-05-17 MED ORDER — INSULIN ASPART 100 UNIT/ML IJ SOLN: 0.0000 [IU] | Freq: Every day | INTRAMUSCULAR | Status: DC

## 2022-05-17 MED ORDER — THROMBIN (RECOMBINANT) 20000 UNITS EX SOLR
CUTANEOUS | Status: AC
Start: 2022-05-17 — End: ?
  Filled 2022-05-17: qty 20000

## 2022-05-17 MED ORDER — BUPIVACAINE-EPINEPHRINE 0.25% -1:200000 IJ SOLN
INTRAMUSCULAR | Status: DC | PRN
  Administered 2022-05-17: 7 mL

## 2022-05-17 MED ORDER — MEPERIDINE HCL 25 MG/ML IJ SOLN: 6.2500 mg | INTRAMUSCULAR | Status: DC | PRN

## 2022-05-17 MED ORDER — CEFAZOLIN SODIUM-DEXTROSE 1-4 GM/50ML-% IV SOLN
1.0000 g | Freq: Three times a day (TID) | INTRAVENOUS | Status: AC
  Administered 2022-05-17 – 2022-05-18 (×2): 1 g via INTRAVENOUS
  Filled 2022-05-17 (×2): qty 50

## 2022-05-17 MED ORDER — HYDROMORPHONE HCL 1 MG/ML IJ SOLN
INTRAMUSCULAR | Status: AC
  Filled 2022-05-17: qty 2

## 2022-05-17 MED ORDER — HYDROMORPHONE HCL 1 MG/ML IJ SOLN: 0.5000 mg | INTRAMUSCULAR | Status: DC | PRN

## 2022-05-17 MED ORDER — OXYCODONE-ACETAMINOPHEN 10-325 MG PO TABS: 1.0000 | ORAL_TABLET | Freq: Four times a day (QID) | ORAL | 0 refills | Status: AC | PRN

## 2022-05-17 MED ORDER — ACETAMINOPHEN 650 MG RE SUPP: 650.0000 mg | RECTAL | Status: DC | PRN

## 2022-05-17 MED ORDER — ORAL CARE MOUTH RINSE: 15.0000 mL | Freq: Once | OROMUCOSAL | Status: AC

## 2022-05-17 MED ORDER — ROCURONIUM BROMIDE 10 MG/ML (PF) SYRINGE
PREFILLED_SYRINGE | INTRAVENOUS | Status: DC | PRN
  Administered 2022-05-17 (×2): 20 mg via INTRAVENOUS
  Administered 2022-05-17: 60 mg via INTRAVENOUS

## 2022-05-17 MED ORDER — SODIUM CHLORIDE 0.9 % IV SOLN: INTRAVENOUS | Status: DC

## 2022-05-17 MED ORDER — BUSPIRONE HCL 5 MG PO TABS
5.0000 mg | ORAL_TABLET | Freq: Two times a day (BID) | ORAL | Status: DC
  Administered 2022-05-17: 5 mg via ORAL
  Filled 2022-05-17 (×3): qty 1

## 2022-05-17 MED ORDER — MENTHOL 3 MG MT LOZG: 1.0000 | LOZENGE | OROMUCOSAL | Status: DC | PRN

## 2022-05-17 MED ORDER — OXYCODONE HCL 5 MG/5ML PO SOLN
ORAL | Status: AC
  Filled 2022-05-17: qty 5

## 2022-05-17 MED ORDER — AMISULPRIDE (ANTIEMETIC) 5 MG/2ML IV SOLN
10.0000 mg | Freq: Once | INTRAVENOUS | Status: AC | PRN
  Administered 2022-05-17: 10 mg via INTRAVENOUS

## 2022-05-17 MED ORDER — LIDOCAINE 2% (20 MG/ML) 5 ML SYRINGE
INTRAMUSCULAR | Status: AC
  Filled 2022-05-17: qty 5

## 2022-05-17 MED ORDER — DEXAMETHASONE SODIUM PHOSPHATE 10 MG/ML IJ SOLN
INTRAMUSCULAR | Status: AC
  Filled 2022-05-17: qty 1

## 2022-05-17 MED ORDER — GABAPENTIN 300 MG PO CAPS
300.0000 mg | ORAL_CAPSULE | Freq: Once | ORAL | Status: AC
  Administered 2022-05-17: 300 mg via ORAL
  Filled 2022-05-17: qty 1

## 2022-05-17 MED ORDER — SODIUM CHLORIDE 0.9% FLUSH: 3.0000 mL | INTRAVENOUS | Status: DC | PRN

## 2022-05-17 MED ORDER — METHOCARBAMOL 500 MG PO TABS: 500.0000 mg | ORAL_TABLET | Freq: Three times a day (TID) | ORAL | 0 refills | Status: AC | PRN

## 2022-05-17 MED ORDER — ONDANSETRON HCL 4 MG PO TABS: 4.0000 mg | ORAL_TABLET | Freq: Three times a day (TID) | ORAL | 0 refills | Status: DC | PRN

## 2022-05-17 MED ORDER — MIRABEGRON ER 50 MG PO TB24
50.0000 mg | ORAL_TABLET | Freq: Every day | ORAL | Status: DC
  Filled 2022-05-17: qty 1

## 2022-05-17 MED ORDER — LIDOCAINE 2% (20 MG/ML) 5 ML SYRINGE
INTRAMUSCULAR | Status: DC | PRN
  Administered 2022-05-17: 100 mg via INTRAVENOUS

## 2022-05-17 MED ORDER — SEMAGLUTIDE (1 MG/DOSE) 4 MG/3ML ~~LOC~~ SOPN
1.0000 mg | PEN_INJECTOR | SUBCUTANEOUS | Status: DC
Start: 2022-05-21 — End: 2022-05-17

## 2022-05-17 MED ORDER — CEFAZOLIN SODIUM-DEXTROSE 2-4 GM/100ML-% IV SOLN
2.0000 g | INTRAVENOUS | Status: AC
  Administered 2022-05-17: 2 g via INTRAVENOUS
  Filled 2022-05-17: qty 100

## 2022-05-17 MED ORDER — AMISULPRIDE (ANTIEMETIC) 5 MG/2ML IV SOLN
INTRAVENOUS | Status: AC
  Filled 2022-05-17: qty 4

## 2022-05-17 MED ORDER — 0.9 % SODIUM CHLORIDE (POUR BTL) OPTIME
TOPICAL | Status: DC | PRN
  Administered 2022-05-17: 1000 mL

## 2022-05-17 MED ORDER — TRANEXAMIC ACID-NACL 1000-0.7 MG/100ML-% IV SOLN
INTRAVENOUS | Status: DC | PRN
  Administered 2022-05-17: 1000 mg via INTRAVENOUS

## 2022-05-17 MED ORDER — INSULIN ASPART 100 UNIT/ML IJ SOLN
0.0000 [IU] | Freq: Three times a day (TID) | INTRAMUSCULAR | Status: DC
  Administered 2022-05-18: 2 [IU] via SUBCUTANEOUS

## 2022-05-17 MED ORDER — ONDANSETRON HCL 4 MG/2ML IJ SOLN
INTRAMUSCULAR | Status: AC
  Filled 2022-05-17: qty 2

## 2022-05-17 MED ORDER — METHOCARBAMOL 500 MG PO TABS
500.0000 mg | ORAL_TABLET | Freq: Four times a day (QID) | ORAL | Status: DC | PRN
  Administered 2022-05-18 (×2): 500 mg via ORAL
  Filled 2022-05-17 (×2): qty 1

## 2022-05-17 MED ORDER — FENTANYL CITRATE (PF) 250 MCG/5ML IJ SOLN
INTRAMUSCULAR | Status: AC
  Filled 2022-05-17: qty 5

## 2022-05-17 SURGICAL SUPPLY — 73 items
BAG COUNTER SPONGE SURGICOUNT (BAG) ×1 IMPLANT
BAND RUBBER #18 3X1/16 STRL (MISCELLANEOUS) IMPLANT
BLADE CLIPPER SURG (BLADE) IMPLANT
BLADE SURG 15 STRL LF DISP TIS (BLADE) IMPLANT
BLADE SURG 15 STRL SS (BLADE)
BUR EGG ELITE 4.0 (BURR) IMPLANT
BUR MATCHSTICK NEURO 3.0 LAGG (BURR) IMPLANT
CABLE BIPOLOR RESECTION CORD (MISCELLANEOUS) ×1 IMPLANT
CANISTER SUCT 3000ML PPV (MISCELLANEOUS) ×1 IMPLANT
CLSR STERI-STRIP ANTIMIC 1/2X4 (GAUZE/BANDAGES/DRESSINGS) ×1 IMPLANT
COVER MAYO STAND STRL (DRAPES) ×3 IMPLANT
COVER PLATE (Plate) ×2 IMPLANT
COVER SURGICAL LIGHT HANDLE (MISCELLANEOUS) ×2 IMPLANT
DEVICE FUSION NANLCK MED 6 6D (Cage) IMPLANT
DRAIN CHANNEL 15F RND FF W/TCR (WOUND CARE) IMPLANT
DRAPE C-ARM 42X72 X-RAY (DRAPES) ×1 IMPLANT
DRAPE MICROSCOPE LEICA 46X105 (MISCELLANEOUS) IMPLANT
DRAPE POUCH INSTRU U-SHP 10X18 (DRAPES) ×1 IMPLANT
DRAPE SURG 17X23 STRL (DRAPES) ×1 IMPLANT
DRAPE U-SHAPE 47X51 STRL (DRAPES) ×1 IMPLANT
DRSG OPSITE POSTOP 3X4 (GAUZE/BANDAGES/DRESSINGS) ×1 IMPLANT
DURAPREP 26ML APPLICATOR (WOUND CARE) ×1 IMPLANT
ELECT COATED BLADE 2.86 ST (ELECTRODE) ×1 IMPLANT
ELECT PENCIL ROCKER SW 15FT (MISCELLANEOUS) ×1 IMPLANT
ELECT REM PT RETURN 9FT ADLT (ELECTROSURGICAL) ×1
ELECTRODE REM PT RTRN 9FT ADLT (ELECTROSURGICAL) ×1 IMPLANT
FUSION NANOLOCK MED 6 6D (Cage) ×2 IMPLANT
GAUZE SPONGE 2X2 8PLY STRL LF (GAUZE/BANDAGES/DRESSINGS) IMPLANT
GLOVE BIO SURGEON STRL SZ 6.5 (GLOVE) ×1 IMPLANT
GLOVE BIOGEL PI IND STRL 6.5 (GLOVE) ×1 IMPLANT
GLOVE BIOGEL PI IND STRL 8.5 (GLOVE) ×1 IMPLANT
GLOVE BIOGEL PI INDICATOR 6.5 (GLOVE)
GLOVE BIOGEL PI INDICATOR 8.5 (GLOVE) ×1
GLOVE SS BIOGEL STRL SZ 8.5 (GLOVE) ×1 IMPLANT
GLOVE SUPERSENSE BIOGEL SZ 8.5 (GLOVE) ×1
GOWN STRL REUS W/ TWL LRG LVL3 (GOWN DISPOSABLE) ×2 IMPLANT
GOWN STRL REUS W/TWL 2XL LVL3 (GOWN DISPOSABLE) ×2 IMPLANT
GOWN STRL REUS W/TWL LRG LVL3 (GOWN DISPOSABLE) ×2
KIT BASIN OR (CUSTOM PROCEDURE TRAY) ×1 IMPLANT
KIT TURNOVER KIT B (KITS) ×1 IMPLANT
NDL SPNL 18GX3.5 QUINCKE PK (NEEDLE) ×1 IMPLANT
NEEDLE HYPO 22GX1.5 SAFETY (NEEDLE) ×1 IMPLANT
NEEDLE SPNL 18GX3.5 QUINCKE PK (NEEDLE) ×1 IMPLANT
NS IRRIG 1000ML POUR BTL (IV SOLUTION) ×1 IMPLANT
PACK ORTHO CERVICAL (CUSTOM PROCEDURE TRAY) ×1 IMPLANT
PACK UNIVERSAL I (CUSTOM PROCEDURE TRAY) ×1 IMPLANT
PAD ARMBOARD 7.5X6 YLW CONV (MISCELLANEOUS) ×2 IMPLANT
PATTIES SURGICAL .25X.25 (GAUZE/BANDAGES/DRESSINGS) ×1 IMPLANT
PIN DISTRACTION MAXCESS-C 14 (PIN) IMPLANT
PLATE LOCK ENDO TCS (Plate) ×2 IMPLANT
PLATE LOCK ENDO TCS F/COVER (Plate) IMPLANT
POSITIONER HEAD DONUT 9IN (MISCELLANEOUS) ×1 IMPLANT
PUTTY BONE DBX 2.5 MIS (Bone Implant) IMPLANT
RESTRAINT LIMB HOLDER UNIV (RESTRAINTS) ×1 IMPLANT
SCREW LOCKING 14MMX3.5MM (Screw) IMPLANT
SPONGE INTESTINAL PEANUT (DISPOSABLE) ×2 IMPLANT
SPONGE SURGIFOAM ABS GEL 100 (HEMOSTASIS) ×1 IMPLANT
SPONGE T-LAP 4X18 ~~LOC~~+RFID (SPONGE) IMPLANT
SURGIFLO W/THROMBIN 8M KIT (HEMOSTASIS) IMPLANT
SUT BONE WAX W31G (SUTURE) ×1 IMPLANT
SUT MNCRL AB 3-0 PS2 27 (SUTURE) ×1 IMPLANT
SUT SILK 2 0 (SUTURE)
SUT SILK 2-0 18XBRD TIE 12 (SUTURE) IMPLANT
SUT VIC AB 2-0 CT1 18 (SUTURE) ×1 IMPLANT
SYR BULB IRRIG 60ML STRL (SYRINGE) ×1 IMPLANT
SYR CONTROL 10ML LL (SYRINGE) ×1 IMPLANT
TAPE CLOTH 4X10 WHT NS (GAUZE/BANDAGES/DRESSINGS) ×1 IMPLANT
TAPE UMBILICAL COTTON 1/8X30 (MISCELLANEOUS) ×1 IMPLANT
TOWEL GREEN STERILE (TOWEL DISPOSABLE) ×1 IMPLANT
TOWEL GREEN STERILE FF (TOWEL DISPOSABLE) ×1 IMPLANT
TRAY FOLEY MTR SLVR 16FR STAT (SET/KITS/TRAYS/PACK) IMPLANT
WATER STERILE IRR 1000ML POUR (IV SOLUTION) ×1 IMPLANT
YANKAUER SUCT BULB TIP NO VENT (SUCTIONS) IMPLANT

## 2022-05-17 NOTE — Brief Op Note (Signed)
05/17/2022  5:59 PM  PATIENT:  Anna [name]  56 y.o. 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, C6-7, status post prior fusion C5-6  PROCEDURE:  Procedure(s) with comments: ANTERIOR CERVICAL DECOMPRESSION/DISCECTOMY FUSION 2 LEVEL (ACDF C4-5, C6-7 AND REMOVAL OF HARDWARE) (N/A) - 4 HRS 3 C-BED  SURGEON:  Surgeon(s) and Role:    Venita Lick, MD - Primary  PHYSICIAN ASSISTANT:   ASSISTANTS: none   ANESTHESIA:   general  EBL:  25 mL   BLOOD ADMINISTERED:none  DRAINS: none   LOCAL MEDICATIONS USED:  MARCAINE     SPECIMEN:  No Specimen  DISPOSITION OF SPECIMEN:  N/A  COUNTS:  YES  TOURNIQUET:  * No tourniquets in log *  DICTATION: .Dragon Dictation  PLAN OF CARE: Admit for overnight observation  PATIENT DISPOSITION:  PACU - hemodynamically stable.

## 2022-05-17 NOTE — Transfer of Care (Signed)
Immediate Anesthesia Transfer of Care Note  Patient: Anna [name]  Procedure(s) Performed: ANTERIOR CERVICAL DECOMPRESSION/DISCECTOMY FUSION 2 LEVEL (ACDF C4-5, C6-7 AND REMOVAL OF HARDWARE) (Neck)  Patient Location: PACU  Anesthesia Type:General  Level of Consciousness: drowsy  Airway & Oxygen Therapy: Patient Spontanous Breathing  Post-op Assessment: Report given to RN and Post -op Vital signs reviewed and stable  Post vital signs: Reviewed and stable  Last Vitals:  Vitals Value Taken Time  BP 136/68 05/17/22 1803  Temp    Pulse 86 05/17/22 1805  Resp 14 05/17/22 1805  SpO2 94 % 05/17/22 1805  Vitals shown include unvalidated device data.  Last Pain:  Vitals:   05/17/22 1237  TempSrc:   PainSc: 0-No pain         Complications: No notable events documented.

## 2022-05-17 NOTE — Anesthesia Preprocedure Evaluation (Addendum)
Anesthesia Evaluation  Patient identified by MRN, date of birth, ID band Patient awake    Reviewed: Allergy & Precautions, NPO status , Patient's Chart, lab work & pertinent test results  History of Anesthesia Complications (+) PONV and history of anesthetic complications  Airway Mallampati: II  TM Distance: >3 FB Neck ROM: Full    Dental no notable dental hx. (+) Dental Advisory Given, Teeth Intact   Pulmonary neg pulmonary ROS, former smoker,    Pulmonary exam normal breath sounds clear to auscultation       Cardiovascular negative cardio ROS Normal cardiovascular exam Rhythm:Regular Rate:Normal     Neuro/Psych PSYCHIATRIC DISORDERS Anxiety negative neurological ROS     GI/Hepatic negative GI ROS, Neg liver ROS,   Endo/Other  diabetes  Renal/GU negative Renal ROS     Musculoskeletal  (+) Arthritis ,   Abdominal (+) + obese,   Peds  Hematology negative hematology ROS (+)   Anesthesia Other Findings   Reproductive/Obstetrics negative OB ROS                            Anesthesia Physical Anesthesia Plan  ASA: 3  Anesthesia Plan: General   Post-op Pain Management: Tylenol PO (pre-op)* and Gabapentin PO (pre-op)*   Induction: Intravenous  PONV Risk Score and Plan: 4 or greater and Ondansetron, Dexamethasone, Treatment may vary due to age or medical condition, Scopolamine patch - Pre-op, Midazolam and Propofol infusion  Airway Management Planned: Oral ETT and Video Laryngoscope Planned  Additional Equipment: None  Intra-op Plan:   Post-operative Plan: Extubation in OR  Informed Consent: I have reviewed the patients History and Physical, chart, labs and discussed the procedure including the risks, benefits and alternatives for the proposed anesthesia with the patient or authorized representative who has indicated his/her understanding and acceptance.     Dental advisory  given  Plan Discussed with: CRNA  Anesthesia Plan Comments:       Anesthesia Quick Evaluation

## 2022-05-17 NOTE — Anesthesia Procedure Notes (Signed)
Procedure Name: Intubation Date/Time: 05/17/2022 2:39 PM  Performed by: Reeves Dam, CRNAPre-anesthesia Checklist: Patient identified, Patient being monitored, Timeout performed, Emergency Drugs available and Suction available Patient Re-evaluated:Patient Re-evaluated prior to induction Oxygen Delivery Method: Circle system utilized Preoxygenation: Pre-oxygenation with 100% oxygen Induction Type: IV induction Ventilation: Mask ventilation without difficulty Laryngoscope Size: Mac, 3 and Glidescope Grade View: Grade I Tube type: Oral Laser Tube: Cuffed inflated with minimal occlusive pressure - saline Tube size: 7.0 mm Number of attempts: 1 Airway Equipment and Method: Stylet Placement Confirmation: ETT inserted through vocal cords under direct vision, positive ETCO2 and breath sounds checked- equal and bilateral Secured at: 21 cm Tube secured with: Tape Dental Injury: Teeth and Oropharynx as per pre-operative assessment  Comments: Intubated with head and neck midline, no extention

## 2022-05-17 NOTE — Op Note (Signed)
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  POST-OPERATIVE DIAGNOSIS: Same  PROCEDURE:   1.  Removal of anterior cervical plate and exploration of C5-6 fusion. 2.  ACDF C4-5 and C6-7  SURGEON:  Venita Lick, MD  PHYSICIAN ASSISTANT: None  ANESTHESIA:   General  EBL: 25 ml   Complications: None  Implants: Titan 0 profile intervertebral cage.  6 millimeter medium lordotic cage with 14 mm locking screws.  Graft: DBX mix  BRIEF HISTORY: Anna [name] is a 56 y.o. female who to my office with complaints of progressive multiple right imaging demonstrated a solid C5-6 ACDF with adjacent segment degenerative disease and foraminal stenosis C4-5 and C6-7.  Attempts at conservative management had failed to alleviate her pain or improve her quality of life.  As a result we elected to move forward with surgery.  All appropriate risks, benefits, and alternatives to surgery were discussed and consent was obtained.  PROCEDURE DETAILS: Patient was brought into the operating room and was properly positioned on the operating room table.  After induction with general anesthesia the patient was endotracheally intubated.  A timeout was taken to confirm all important data: including patient, procedure, and the level. Teds, SCD's were applied.   A Foley was placed by the nurse and the anterior cervical spine was prepped and draped in a standard fashion.  Patient had a previous left-sided approach and so I elected to proceed on the right side.  I marked out the C5-6 level and infiltrated the transverse incision with quarter percent Marcaine with epinephrine.  Transverse incision was made and sharp dissection was carried out down to and through the platysma.  I then sharply dissected down through  the deep cervical fascia isolating the omohyoid muscle and sacrificing it for visualization.  I then bluntly dissected through the remainder of the prevertebral fascia until I could mobilize the esophagus to the left and visualize the anterior cervical plate.  I could now see the C4-5 level and the C6-7 level as well as the cervical plate.  At this point I mobilized the longus coli muscle from the inferior aspect of C6-7 to the superior aspect of C4.  I then placed Caspar retracting blades underneath the longus coli muscle expose the anterior cervical spine.  The screws from the cervical plate were removed as was the plate itself.  I then visualized the fusion at C5-6 and appeared to be solid.  Distraction pins were then placed into the body of C4 and through one of the previous screw holes at C5.  An annulotomy was performed at C4-5 and I remove the bulk of the disc material.  A lamina spreader was then placed to distract the intervertebral space and maintain this with the distraction pin set.  Using curettes I remove the remainder of the disc until I was at the posterior aspect of the vertebral body.  Using a fine nerve hook I gently dissected through the posterior disc material down to the posterior longitudinal ligament.  I resected the bulk of the disc material with a 1 mm Kerrison rongeur.  I then dissected through the PLL and created a space between the thecal sac and the PLL.  I resected the PLL with a 1 mm Kerrison rongeur and then undercut the uncovertebral joints for further decompression of the nerve roots.  At this point I could freely run my nerve hook behind the vertebral bodies to C4 and C5 and under the uncovertebral joints.  I then rasped the endplates to ensure had bleeding subchondral bone and then used trial rasping devices.  I elected to use the 6 mm medium lordotic cage.  The cage was obtained and packed with allograft and malleted into position.  I then placed locking screws through the  cage and into the C5 and C4 vertebral body.  Both locking screws had excellent purchase.  The locking plate was then inserted to prevent backout of the screws.  With the C4-5 ACDF complete I repositioned the retractor to expose the C6-7 level.  Using the same technique I used at C4-5 I exposed and removed the disc material.  The distraction was used and maintained with the distraction pin set.  I continue to work posteriorly removing all of the disc material in a similar fashion the right side at C4-5.  Once I completed all of the disc ectomy I then dissected with a nerve hook posteriorly in order to resect the posterior disc material and annulus.  The posterior longitudinal ligament was also resected with a 1 mm Kerrison rongeur.  I then undercut the uncovertebral joints to complete the decompression.  I rasped the endplates I had bleeding subchondral bone and used the same size cage that had used at C4-5.  Cage was packed with allograft and malleted into position.  The locking screws were then placed.  Both screws had excellent purchase.  The locking plate was then inserted and locked in place.  At this point I removed all the retractors and irrigated the wound copiously with normal saline.  Make sure that hemostasis using bipolar cautery.  After final irrigation the trachea and esophagus were returned to midline and I closed the platysma with interrupted 2-0 Vicryl sutures.  The skin was then closed with 3-0 Monocryl.  Steri-Strips and a dry dressing were applied and the patient was ultimately extubated and transferred to PACU without incident.  End of the case all needle sponge counts were correct.  There were no adverse intraoperative events.  Venita Lick, MD 05/17/2022 5:49 PM

## 2022-05-17 NOTE — H&P (Signed)
History:  Anna Anna [name]. Anna Anna [name]. Despite injection therapy, and self-directed therapy exercises her quality of life is continued to deteriorate. Imaging studies clearly show adjacent segment degeneration at C4-5 and C6-7 with foraminal stenosis resulting in nerve root compression. At this point Anna would like to move forward with surgery which I think is reasonable. Surgical plan would be to revision ACDF C4-5 and C6-7.   Past Medical History:  Diagnosis Date   Acute meniscal tear of Anna [name] knee    Anxiety    Arthritis    NECK , KNEE   Diabetes mellitus without complication (HCC)    OAB (overactive bladder)    PONV (postoperative nausea and vomiting)    Seasonal allergies    Wears glasses     No Known Allergies  No current facility-administered medications on file prior to encounter.   Current Outpatient Medications on File Prior to Encounter  Medication Sig Dispense Refill   azelastine (ASTELIN) 0.1 % nasal spray Place 1 spray into both nostrils 2 (two) times daily as needed for rhinitis. Use in each nostril as directed     busPIRone (BUSPAR) 5 MG tablet Take 5 mg by mouth 2 (two) times daily.     colestipol (COLESTID) 1 g tablet Take 1 g by mouth See admin instructions. 2 g in the morning, 1 g in the afternoon     Estradiol (VAGIFEM) 10 MCG TABS vaginal tablet Place 10 mcg vaginally 2 (two) times a week.     fexofenadine (ALLEGRA) 180 MG tablet Take 180 mg by mouth daily as needed for allergies or rhinitis.     fluticasone (FLONASE) 50 MCG/ACT nasal spray Place 1 spray into both nostrils daily as needed for allergies or rhinitis.     ibuprofen (ADVIL) 200 MG tablet Take 600 mg by mouth every 8 (eight) hours as needed (pain).     LYLLANA 0.05 MG/24HR patch Place 1 patch onto the skin 2 (two) times a week.     montelukast (SINGULAIR) 10 MG tablet Take  10 mg by mouth at bedtime.     MYRBETRIQ 50 MG TB24 tablet Take 50 mg by mouth daily.     olopatadine (PATANOL) 0.1 % ophthalmic solution Place 1 drop into both eyes daily as needed for allergies.     Prasterone (INTRAROSA) 6.5 MG INST Place 6.5 mg vaginally once a week.     Semaglutide, 1 MG/DOSE, (OZEMPIC, 1 MG/DOSE,) 4 MG/3ML SOPN Inject 1 mg into the skin every Friday.     traMADol (ULTRAM) 50 MG tablet Take 50 mg by mouth 3 (three) times daily as needed.      Physical Exam: Vitals:   05/17/22 1212  BP: 127/80  Pulse: 83  Resp: 18  Temp: 98 F (36.7 C)  SpO2: 96%   Body mass index is 33.09 kg/m. Clinical exam: Anna Anna [name] is a pleasant individual, who appears younger than their stated age.  Anna is alert and orientated 3.  No shortness of breath, chest pain.  Abdomen is soft and non-tender, negative loss of bowel and bladder control, no rebound tenderness.  Negative: skin lesions abrasions contusions  Peripheral pulses: 2+ radial artery pulses bilaterally. LE compartments are: Soft and nontender.  Gait pattern: Normal  Assistive devices: None  Neuro: Positive numbness and dysesthesias in the C5 Anna [name] bilaterally right worse than the Anna [name]. Negative Spurling  sign, negative Hoffman test. 5/5 motor strength in the upper extremity bilaterally. 1+ deep tendon reflexes symmetrically in the upper extremity.  Musculoskeletal: significant neck pain with occipital headaches. Limited range of motion due to pain. No significant anterior shoulder, elbow, or wrist pain with isolated joint range of motion  Imaging: X-rays of the cervical spine demonstrate loss of normal cervical lordosis with a prior C5-6 fusion. There is adjacent segment disease at the C4-5 and to a lesser degree C6-7 level.  Patient had recent C5 selective nerve root block with positive results.  Cervical MRI: completed on 04/08/2022: Patient has a very small cord signal change at C5-6 consistent with very mild  myelomalacia or myelopathy. Prior ACDF at C5-6 with no residual central stenosis. Severe bilateral neuroforaminal narrowing at C6-7 with compression of the C7 nerve roots. Severe bilateral C4-5 neuroforaminal narrowing affecting the exiting C5 nerve roots. There is also severe Anna [name] C3-4 neuroforaminal narrowing but only mild to moderate central stenosis.   A/P: Anna Anna [name] continues to have severe neck and neuropathic arm pain right worse than the Anna [name]. Anna Anna [name]. Despite injection therapy, and self-directed therapy exercises her quality of life is continued to deteriorate. Imaging studies clearly show adjacent segment degeneration at C4-5 and C6-7 with foraminal stenosis resulting in nerve root compression. At this point Anna would like to move forward with surgery which I think is reasonable. Surgical plan would be to revision ACDF C4-5 and C6-7. This would require removing the existing cervical plate and then moving forward with an ACDF at the adjacent levels and applying a new cervical plate. I have gone over the surgical plan including the risks and benefits with the patient and all of her questions were addressed. Risks and benefits of surgery were discussed with the patient. These include: Infection, bleeding, death, stroke, paralysis, ongoing or worse pain, need for additional surgery, nonunion, leak of spinal fluid, adjacent segment degeneration requiring additional fusion surgery. Pseudoarthrosis (nonunion)requiring supplemental posterior fixation. Throat pain, swallowing difficulties, hoarseness or change in voice. Patient has been seen by her ear nose and throat specialist and noted to have no contraindications to the right-sided approach. Anna has also been seen and cleared by her primary care physician.

## 2022-05-17 NOTE — Discharge Instructions (Signed)

## 2022-05-18 DIAGNOSIS — Z79899 Other long term (current) drug therapy: Secondary | ICD-10-CM | POA: Diagnosis not present

## 2022-05-18 DIAGNOSIS — E119 Type 2 diabetes mellitus without complications: Secondary | ICD-10-CM | POA: Diagnosis not present

## 2022-05-18 DIAGNOSIS — M50123 Cervical disc disorder at C6-C7 level with radiculopathy: Secondary | ICD-10-CM | POA: Diagnosis not present

## 2022-05-18 DIAGNOSIS — M50121 Cervical disc disorder at C4-C5 level with radiculopathy: Secondary | ICD-10-CM | POA: Diagnosis not present

## 2022-05-18 DIAGNOSIS — Z981 Arthrodesis status: Secondary | ICD-10-CM | POA: Diagnosis not present

## 2022-05-18 DIAGNOSIS — M4722 Other spondylosis with radiculopathy, cervical region: Secondary | ICD-10-CM | POA: Diagnosis not present

## 2022-05-18 LAB — GLUCOSE, CAPILLARY: Glucose-Capillary: 142 mg/dL — ABNORMAL HIGH (ref 70–99)

## 2022-05-18 MED FILL — Thrombin (Recombinant) For Soln 20000 Unit: CUTANEOUS | Qty: 1 | Status: AC

## 2022-05-18 NOTE — Progress Notes (Signed)
PT Cancellation Note and Discharge  Patient Details Name: Anna [name] MRN: [id number] DOB: 06/16/1966   Cancelled Treatment:    Reason Eval/Treat Not Completed: PT screened, no needs identified, will sign off. Discussed pt case with OT who reports pt is currently mobilizing without assistance and does not require a formal PT evaluation at this time. PT signing off. If needs change, please reconsult.     [name] 05/18/2022, 9:33 AM  Conni Slipper, PT, DPT Acute Rehabilitation Services Secure Chat Preferred Office: [phone]

## 2022-05-18 NOTE — Evaluation (Signed)
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 Currently she is back to her baseline of independent to Mod I having to modify the way she does things due to cervical collar and precautions. All education completed, we will D/C from acute OT.      Recommendations for follow up therapy are one component of a multi-disciplinary discharge planning process, led by the attending physician.  Recommendations may be updated based on patient status, additional functional criteria and insurance authorization.   Follow Up Recommendations  No OT follow up    Assistance Recommended at Discharge PRN  Patient can return home with the following Assistance with cooking/housework;Assist for transportation    Functional Status Assessment  Patient has had a recent decline in their functional status and demonstrates the ability to make significant improvements in function in a reasonable and predictable amount of time. (without further skilled OT needs)  Equipment Recommendations  None recommended by OT       Precautions / Restrictions Precautions Precautions: Cervical Precaution Booklet Issued: Yes (comment) Required Braces or Orthoses: Cervical Brace Cervical Brace: Hard collar (per pt she reported MD said she could have it off in bed, to eat, and to shower) Restrictions Weight Bearing Restrictions: No      Mobility Bed Mobility Overal bed mobility: Modified Independent                   Transfers Overall transfer level: Independent Equipment used: None                      Balance Overall balance assessment: Independent                                         ADL either performed or assessed with clinical judgement   ADL Overall ADL's : Modified independent                                       General ADL Comments: Educated on taking off/putting on collar, use of straws for cups, use of 2 cups for brushing teeth (one to spit and one to rinse), putting on and taking off upper body and lower body clothing, care of cervical collar, how to get out of a flat bed.     Vision Patient Visual Report: No change from baseline              Pertinent Vitals/Pain Pain Assessment Pain Assessment: 0-10 Pain Score: 3  Pain Location: incisional Pain Descriptors / Indicators: Aching, Sore Pain Intervention(s): Limited activity within patient's tolerance, Monitored during session, Repositioned     Hand Dominance Right   Extremity/Trunk Assessment Upper Extremity Assessment Upper Extremity Assessment: Overall WFL for tasks assessed           Communication Communication Communication: No difficulties  Cognition Arousal/Alertness: Awake/alert Behavior During Therapy: WFL for tasks assessed/performed Overall Cognitive Status: Within Functional Limits for tasks assessed                                                  Home Living Family/patient expects to be discharged to:: Private residence Living Arrangements: Spouse/significant other Available Help at Discharge: Family;Available 24 hours/day (through end of week then husband back to work, son will be there on Friday) Type of Home: House Home Access: Level entry     Home Layout: One level     Bathroom Shower/Tub: IT trainer: Standard     Home Equipment: Hand held shower head          Prior  Functioning/Environment Prior Level of Function : Independent/Modified Independent                        OT Problem List: Pain         OT Goals(Current goals can be found in the care plan section) Acute Rehab OT Goals Patient Stated Goal: to go home today         AM-PAC OT "6 Clicks" Daily Activity     Outcome Measure Help from another person eating meals?: None Help from another person taking care of personal grooming?: None Help from another person toileting, which includes using toliet, bedpan, or urinal?: None Help from another person bathing (including washing, rinsing, drying)?: None Help from another person to put on and taking off regular upper body clothing?: None Help from another person to put on and taking off regular lower body clothing?: None 6 Click Score: 24   End of Session Equipment Utilized During Treatment: Cervical collar Nurse Communication:  (no further OT or PT needs)  Activity Tolerance: Patient tolerated treatment well Patient left: in bed;with call bell/phone within reach  OT Visit Diagnosis: Pain Pain - part of body:  (incisional)                Time: 2426-8341 OT Time Calculation (min): 37 min Charges:  OT General Charges $OT Visit: 1 Visit OT Evaluation $OT Eval Moderate Complexity: 1 Mod OT Treatments $Self Care/Home Management : 8-22 mins  Ignacia Palma, OTR/L Acute Rehab Services [facility] [phone] Office [phone]    Anna [name] 05/18/2022, 9:39 AM

## 2022-05-18 NOTE — Discharge Summary (Signed)
Patient ID: Anna [name] MRN: [id number] DOB/AGE: 57-Aug-1967 57 y.o.  Admit date: 05/17/2022 Discharge date: 05/18/2022  Admission Diagnoses:  Principal Problem:   Cervical disc herniation Active Problems:   Cervical radiculopathy   Discharge Diagnoses:  Principal Problem:   Cervical disc herniation Active Problems:   Cervical radiculopathy  status post Procedure(s): ANTERIOR CERVICAL DECOMPRESSION/DISCECTOMY FUSION 2 LEVEL (ACDF C4-5, C6-7 AND REMOVAL OF HARDWARE)  Past Medical History:  Diagnosis Date   Acute meniscal tear of left knee    Anxiety    Arthritis    NECK , KNEE   Diabetes mellitus without complication (HCC)    OAB (overactive bladder)    PONV (postoperative nausea and vomiting)    Seasonal allergies    Wears glasses     Surgeries: Procedure(s): ANTERIOR CERVICAL DECOMPRESSION/DISCECTOMY FUSION 2 LEVEL (ACDF C4-5, C6-7 AND REMOVAL OF HARDWARE) on 05/17/2022   Consultants:   Discharged Condition: Improved  Hospital Course: Anna [name] is an 56 y.o. female who was admitted 05/17/2022 for operative treatment of Cervical disc herniation. Patient failed conservative treatments (please see the history and physical for the specifics) and had severe unremitting pain that affects sleep, daily activities and work/hobbies. After pre-op clearance, the patient was taken to the operating room on 05/17/2022 and underwent  Procedure(s): ANTERIOR CERVICAL DECOMPRESSION/DISCECTOMY FUSION 2 LEVEL (ACDF C4-5, C6-7 AND REMOVAL OF HARDWARE).    Patient was given perioperative antibiotics:  Anti-infectives (From admission, onward)    Start     Dose/Rate Route Frequency Ordered Stop   05/17/22 2200  ceFAZolin (ANCEF) IVPB 1 g/50 mL premix        1 g 100 mL/hr over 30 Minutes Intravenous Every 8 hours 05/17/22 2029 05/18/22 0447   05/17/22 1210  ceFAZolin (ANCEF) IVPB 2g/100 mL premix        2 g 200 mL/hr over 30 Minutes Intravenous 30 min pre-op 05/17/22 1210  05/17/22 1429        Patient was given sequential compression devices and early ambulation to prevent DVT.   Patient benefited maximally from hospital stay and there were no complications. At the time of discharge, the patient was urinating/moving their bowels without difficulty, tolerating a regular diet, pain is controlled with oral pain medications and they have been cleared by PT/OT.   Recent vital signs: Patient Vitals for the past 24 hrs:  BP Temp Temp src Pulse Resp SpO2 Height Weight  05/18/22 0739 125/67 97.8 F (36.6 C) -- 87 16 97 % -- --  05/18/22 0245 118/73 98.4 F (36.9 C) Oral 82 20 96 % -- --  05/17/22 2357 134/76 97.8 F (36.6 C) Oral 68 20 99 % -- --  05/17/22 2026 137/75 97.6 F (36.4 C) Oral 75 18 95 % -- --  05/17/22 2000 119/70 97.6 F (36.4 C) -- 77 15 94 % -- --  05/17/22 1915 139/84 (!) 96.8 F (36 C) -- 70 10 100 % -- --  05/17/22 1900 137/76 -- -- 64 13 99 % -- --  05/17/22 1845 133/73 -- -- 71 19 94 % -- --  05/17/22 1830 133/73 -- -- 78 17 98 % -- --  05/17/22 1818 (!) 146/79 -- -- 75 15 98 % -- --  05/17/22 1803 136/68 97.8 F (36.6 C) -- 85 13 93 % -- --  05/17/22 1212 127/80 98 F (36.7 C) Oral 83 18 96 % 5\' 6"  (1.676 m) 93 kg     Recent laboratory studies: No results for  input(s): "WBC", "HGB", "HCT", "PLT", "NA", "K", "CL", "CO2", "BUN", "CREATININE", "GLUCOSE", "INR", "CALCIUM" in the last 72 hours.  Invalid input(s): "PT", "2"   Discharge Medications:   Allergies as of 05/18/2022   No Known Allergies      Medication List     STOP taking these medications    ibuprofen 200 MG tablet Commonly known as: ADVIL   traMADol 50 MG tablet Commonly known as: ULTRAM       TAKE these medications    azelastine 0.1 % nasal spray Commonly known as: ASTELIN Place 1 spray into both nostrils 2 (two) times daily as needed for rhinitis. Use in each nostril as directed   busPIRone 5 MG tablet Commonly known as: BUSPAR Take 5 mg by mouth  2 (two) times daily.   colestipol 1 g tablet Commonly known as: COLESTID Take 1 g by mouth See admin instructions. 2 g in the morning, 1 g in the afternoon   fexofenadine 180 MG tablet Commonly known as: ALLEGRA Take 180 mg by mouth daily as needed for allergies or rhinitis.   fluticasone 50 MCG/ACT nasal spray Commonly known as: FLONASE Place 1 spray into both nostrils daily as needed for allergies or rhinitis.   Intrarosa 6.5 MG Inst Generic drug: Prasterone Place 6.5 mg vaginally once a week.   Lyllana 0.05 MG/24HR patch Generic drug: estradiol Place 1 patch onto the skin 2 (two) times a week.   methocarbamol 500 MG tablet Commonly known as: ROBAXIN Take 1 tablet (500 mg total) by mouth every 8 (eight) hours as needed for up to 5 days for muscle spasms.   montelukast 10 MG tablet Commonly known as: SINGULAIR Take 10 mg by mouth at bedtime.   Myrbetriq 50 MG Tb24 tablet Generic drug: mirabegron ER Take 50 mg by mouth daily.   olopatadine 0.1 % ophthalmic solution Commonly known as: PATANOL Place 1 drop into both eyes daily as needed for allergies.   ondansetron 4 MG tablet Commonly known as: Zofran Take 1 tablet (4 mg total) by mouth every 8 (eight) hours as needed for nausea or vomiting.   oxyCODONE-acetaminophen 10-325 MG tablet Commonly known as: Percocet Take 1 tablet by mouth every 6 (six) hours as needed for up to 5 days for pain.   Ozempic (1 MG/DOSE) 4 MG/3ML Sopn Generic drug: Semaglutide (1 MG/DOSE) Inject 1 mg into the skin every Friday.   Vagifem 10 MCG Tabs vaginal tablet Generic drug: Estradiol Place 10 mcg vaginally 2 (two) times a week.        Diagnostic Studies: DG Cervical Spine Complete  Result Date: 05/17/2022 CLINICAL DATA:  ACDF C4-C5 and C6-C7. EXAM: CERVICAL SPINE - COMPLETE 4+ VIEW COMPARISON:  Cervical spine x-ray 04/11/2013 FINDINGS: Intraoperative cervical spine. Four low resolution intraoperative spot views of the cervical  spine were obtained. Disc spaces are seen at C4-C5 and C6-C7. Alignment is anatomic. No fracture visible on the limited views. Total fluoroscopy time: 53 seconds Total radiation dose: 4.81 micro Gy IMPRESSION: ACDF C4-C5 and C6-C7. Electronically Signed   By: Darliss Cheney M.D.   On: 05/17/2022 18:04   DG C-Arm 1-60 Min-No Report  Result Date: 05/17/2022 Fluoroscopy was utilized by the requesting physician.  No radiographic interpretation.   DG C-Arm 1-60 Min-No Report  Result Date: 05/17/2022 Fluoroscopy was utilized by the requesting physician.  No radiographic interpretation.   DG C-Arm 1-60 Min-No Report  Result Date: 05/17/2022 Fluoroscopy was utilized by the requesting physician.  No radiographic interpretation.  Discharge Instructions     Incentive spirometry RT   Complete by: As directed         Follow-up Information     Venita Lick, MD. Call in 2 week(s).   Specialty: Orthopedic Surgery Why: If symptoms worsen, For wound re-check, For suture removal Contact information: [address] [place] [address] [phone]                 Discharge Plan:  discharge to home  Disposition: Patient is doing quite well status post removal of cervical plate and ACDF B3-4 and C6-7.  She is complaining of throat pain which is to be expected.  Her preoperative radicular arm pain has improved and she is neurologically intact.  She is ambulating without great difficulty.  Her pain is controlled with oral medications.  She has no shortness of breath or chest pain with intact peripheral pulses throughout.  Plan is to discharge patient to home.  She has been given a prescription for Percocet, Robaxin, and Zofran.  Appropriate discharge instructions have been provided.  She will follow-up with me in 2 weeks for reevaluation.    Signed: Alvy Beal for Dr. Venita Lick [facility] [phone] 05/18/2022, 7:41 AM

## 2022-05-18 NOTE — Anesthesia Postprocedure Evaluation (Signed)
Anesthesia Post Note  Patient: Anna [name]  Procedure(s) Performed: ANTERIOR CERVICAL DECOMPRESSION/DISCECTOMY FUSION 2 LEVEL (ACDF C4-5, C6-7 AND REMOVAL OF HARDWARE) (Neck)     Patient location during evaluation: PACU Anesthesia Type: General Level of consciousness: awake and alert Pain management: pain level controlled Vital Signs Assessment: post-procedure vital signs reviewed and stable Respiratory status: spontaneous breathing, nonlabored ventilation, respiratory function stable and patient connected to nasal cannula oxygen Cardiovascular status: blood pressure returned to baseline and stable Postop Assessment: no apparent nausea or vomiting Anesthetic complications: no   No notable events documented.          Shelton Silvas

## 2022-05-18 NOTE — Progress Notes (Signed)
Patient alert and oriented, mae's well, voiding adequate amount of urine, swallowing without difficulty, no c/o pain at time of discharge. Patient discharged home with family. Script and discharged instructions given to patient. Patient and family stated understanding of instructions given. Patient has an appointment with Dr. Brooks  

## 2022-05-18 NOTE — TOC Transition Note (Signed)
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 PTA, pt independent and living at home with spouse/family, who can provide needed assistance.  PT/OT recommending no OP follow up or DME. Plan dc today.    Final next level of care: Home/Self Care Barriers to Discharge: Barriers Resolved                         Discharge Plan and Services   Discharge Planning Services: CM Consult                                 Social Determinants of Health (SDOH) Interventions     Readmission Risk Interventions     No data to display         Quintella Baton, RN, BSN  Trauma/Neuro ICU Case Manager [phone]

## 2022-05-18 NOTE — Plan of Care (Signed)

## 2022-05-19 ENCOUNTER — Encounter (HOSPITAL_COMMUNITY): Payer: Self-pay | Admitting: Orthopedic Surgery

## 2022-06-29 DIAGNOSIS — M25512 Pain in left shoulder: Secondary | ICD-10-CM | POA: Diagnosis not present

## 2022-06-29 DIAGNOSIS — Z4889 Encounter for other specified surgical aftercare: Secondary | ICD-10-CM | POA: Diagnosis not present

## 2022-06-29 DIAGNOSIS — M542 Cervicalgia: Secondary | ICD-10-CM | POA: Diagnosis not present

## 2022-06-30 DIAGNOSIS — M25512 Pain in left shoulder: Secondary | ICD-10-CM | POA: Diagnosis not present

## 2022-06-30 DIAGNOSIS — M542 Cervicalgia: Secondary | ICD-10-CM | POA: Diagnosis not present

## 2022-07-02 DIAGNOSIS — M25512 Pain in left shoulder: Secondary | ICD-10-CM | POA: Diagnosis not present

## 2022-07-02 DIAGNOSIS — M542 Cervicalgia: Secondary | ICD-10-CM | POA: Diagnosis not present

## 2022-07-05 DIAGNOSIS — M25512 Pain in left shoulder: Secondary | ICD-10-CM | POA: Diagnosis not present

## 2022-07-05 DIAGNOSIS — M542 Cervicalgia: Secondary | ICD-10-CM | POA: Diagnosis not present

## 2022-07-07 DIAGNOSIS — M25512 Pain in left shoulder: Secondary | ICD-10-CM | POA: Diagnosis not present

## 2022-07-07 DIAGNOSIS — M542 Cervicalgia: Secondary | ICD-10-CM | POA: Diagnosis not present

## 2022-07-12 DIAGNOSIS — M542 Cervicalgia: Secondary | ICD-10-CM | POA: Diagnosis not present

## 2022-07-12 DIAGNOSIS — M25512 Pain in left shoulder: Secondary | ICD-10-CM | POA: Diagnosis not present

## 2022-07-14 DIAGNOSIS — M542 Cervicalgia: Secondary | ICD-10-CM | POA: Diagnosis not present

## 2022-07-14 DIAGNOSIS — M25512 Pain in left shoulder: Secondary | ICD-10-CM | POA: Diagnosis not present

## 2022-07-15 DIAGNOSIS — F419 Anxiety disorder, unspecified: Secondary | ICD-10-CM | POA: Diagnosis not present

## 2022-07-15 DIAGNOSIS — Z23 Encounter for immunization: Secondary | ICD-10-CM | POA: Diagnosis not present

## 2022-07-15 DIAGNOSIS — Z79899 Other long term (current) drug therapy: Secondary | ICD-10-CM | POA: Diagnosis not present

## 2022-07-15 DIAGNOSIS — Z7985 Long-term (current) use of injectable non-insulin antidiabetic drugs: Secondary | ICD-10-CM | POA: Diagnosis not present

## 2022-07-15 DIAGNOSIS — E119 Type 2 diabetes mellitus without complications: Secondary | ICD-10-CM | POA: Diagnosis not present

## 2022-07-20 DIAGNOSIS — M542 Cervicalgia: Secondary | ICD-10-CM | POA: Diagnosis not present

## 2022-07-20 DIAGNOSIS — M25512 Pain in left shoulder: Secondary | ICD-10-CM | POA: Diagnosis not present

## 2022-07-20 DIAGNOSIS — Z981 Arthrodesis status: Secondary | ICD-10-CM | POA: Diagnosis not present

## 2022-07-22 DIAGNOSIS — M542 Cervicalgia: Secondary | ICD-10-CM | POA: Diagnosis not present

## 2022-07-22 DIAGNOSIS — Z981 Arthrodesis status: Secondary | ICD-10-CM | POA: Diagnosis not present

## 2022-07-22 DIAGNOSIS — M25512 Pain in left shoulder: Secondary | ICD-10-CM | POA: Diagnosis not present

## 2022-07-26 DIAGNOSIS — Z981 Arthrodesis status: Secondary | ICD-10-CM | POA: Diagnosis not present

## 2022-07-26 DIAGNOSIS — M542 Cervicalgia: Secondary | ICD-10-CM | POA: Diagnosis not present

## 2022-07-26 DIAGNOSIS — M25512 Pain in left shoulder: Secondary | ICD-10-CM | POA: Diagnosis not present

## 2022-07-28 DIAGNOSIS — M25512 Pain in left shoulder: Secondary | ICD-10-CM | POA: Diagnosis not present

## 2022-07-28 DIAGNOSIS — Z981 Arthrodesis status: Secondary | ICD-10-CM | POA: Diagnosis not present

## 2022-07-28 DIAGNOSIS — M542 Cervicalgia: Secondary | ICD-10-CM | POA: Diagnosis not present

## 2022-08-02 DIAGNOSIS — M25512 Pain in left shoulder: Secondary | ICD-10-CM | POA: Diagnosis not present

## 2022-08-02 DIAGNOSIS — M542 Cervicalgia: Secondary | ICD-10-CM | POA: Diagnosis not present

## 2022-08-02 DIAGNOSIS — Z981 Arthrodesis status: Secondary | ICD-10-CM | POA: Diagnosis not present

## 2022-08-04 DIAGNOSIS — M542 Cervicalgia: Secondary | ICD-10-CM | POA: Diagnosis not present

## 2022-08-04 DIAGNOSIS — Z981 Arthrodesis status: Secondary | ICD-10-CM | POA: Diagnosis not present

## 2022-08-04 DIAGNOSIS — M25512 Pain in left shoulder: Secondary | ICD-10-CM | POA: Diagnosis not present

## 2022-08-10 DIAGNOSIS — Z4889 Encounter for other specified surgical aftercare: Secondary | ICD-10-CM | POA: Diagnosis not present

## 2022-08-20 DIAGNOSIS — M542 Cervicalgia: Secondary | ICD-10-CM | POA: Diagnosis not present

## 2022-08-20 DIAGNOSIS — M25512 Pain in left shoulder: Secondary | ICD-10-CM | POA: Diagnosis not present

## 2022-08-23 DIAGNOSIS — M542 Cervicalgia: Secondary | ICD-10-CM | POA: Diagnosis not present

## 2022-08-23 DIAGNOSIS — M25512 Pain in left shoulder: Secondary | ICD-10-CM | POA: Diagnosis not present

## 2022-08-25 DIAGNOSIS — M25511 Pain in right shoulder: Secondary | ICD-10-CM | POA: Diagnosis not present

## 2022-08-25 DIAGNOSIS — Z4889 Encounter for other specified surgical aftercare: Secondary | ICD-10-CM | POA: Diagnosis not present

## 2022-09-03 DIAGNOSIS — M961 Postlaminectomy syndrome, not elsewhere classified: Secondary | ICD-10-CM | POA: Diagnosis not present

## 2022-09-03 DIAGNOSIS — Z4889 Encounter for other specified surgical aftercare: Secondary | ICD-10-CM | POA: Diagnosis not present

## 2022-09-07 DIAGNOSIS — M5412 Radiculopathy, cervical region: Secondary | ICD-10-CM | POA: Diagnosis not present

## 2022-09-19 DIAGNOSIS — M25512 Pain in left shoulder: Secondary | ICD-10-CM | POA: Diagnosis not present

## 2022-09-19 DIAGNOSIS — M25511 Pain in right shoulder: Secondary | ICD-10-CM | POA: Diagnosis not present

## 2022-09-28 DIAGNOSIS — E119 Type 2 diabetes mellitus without complications: Secondary | ICD-10-CM | POA: Insufficient documentation

## 2022-09-28 DIAGNOSIS — M75110 Incomplete rotator cuff tear or rupture of unspecified shoulder, not specified as traumatic: Secondary | ICD-10-CM | POA: Insufficient documentation

## 2022-09-28 DIAGNOSIS — M19019 Primary osteoarthritis, unspecified shoulder: Secondary | ICD-10-CM | POA: Insufficient documentation

## 2023-11-15 DIAGNOSIS — M75111 Incomplete rotator cuff tear or rupture of right shoulder, not specified as traumatic: Secondary | ICD-10-CM | POA: Insufficient documentation

## 2023-12-07 DIAGNOSIS — E785 Hyperlipidemia, unspecified: Secondary | ICD-10-CM | POA: Insufficient documentation

## 2023-12-07 DIAGNOSIS — R931 Abnormal findings on diagnostic imaging of heart and coronary circulation: Secondary | ICD-10-CM | POA: Insufficient documentation

## 2023-12-07 DIAGNOSIS — I2510 Atherosclerotic heart disease of native coronary artery without angina pectoris: Secondary | ICD-10-CM | POA: Insufficient documentation

## 2023-12-07 DIAGNOSIS — Z8249 Family history of ischemic heart disease and other diseases of the circulatory system: Secondary | ICD-10-CM | POA: Insufficient documentation

## 2024-02-21 ENCOUNTER — Other Ambulatory Visit: Payer: Self-pay | Admitting: Obstetrics and Gynecology

## 2024-02-21 DIAGNOSIS — R928 Other abnormal and inconclusive findings on diagnostic imaging of breast: Secondary | ICD-10-CM

## 2024-03-08 ENCOUNTER — Ambulatory Visit
Admission: RE | Admit: 2024-03-08 | Discharge: 2024-03-08 | Disposition: A | Discharge status: Home / Self Care | Source: Ambulatory Visit | Attending: Obstetrics and Gynecology | Admitting: Obstetrics and Gynecology

## 2024-03-08 ENCOUNTER — Ambulatory Visit

## 2024-03-08 DIAGNOSIS — R928 Other abnormal and inconclusive findings on diagnostic imaging of breast: Secondary | ICD-10-CM

## 2024-03-19 HISTORY — PX: ROTATOR CUFF REPAIR: SHX139

## 2024-04-13 DIAGNOSIS — Z9889 Other specified postprocedural states: Secondary | ICD-10-CM | POA: Insufficient documentation

## 2024-08-31 ENCOUNTER — Telehealth: Payer: Self-pay | Admitting: Neurosurgery

## 2024-08-31 NOTE — Telephone Encounter (Signed)
 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 She states she had shoulder surgery with [facility] and also saw Dr. Onetha at [facility]. Patient is requesting records be sent to our office.

## 2024-09-17 ENCOUNTER — Encounter: Payer: Self-pay | Admitting: Neurosurgery

## 2024-09-19 NOTE — Telephone Encounter (Signed)
 Patient is scheduled for 10/10/2024.

## 2024-09-19 NOTE — Telephone Encounter (Signed)
 Waiting on completed note from [facility] to be sent.

## 2024-09-28 NOTE — Progress Notes (Unsigned)
 "   Referring Physician:  Melvenia Debby Cain, PA-C [address] [place],  [place] [address]  Primary Physician:  Anna Bouchard, DO  History of Present Illness: 09/28/2024 [name] is here today with a chief complaint of ***  Brachial Plexus injury    PreCharting for nerve patients : EMG - 06/26/2024 at [facility], Ultrasound or MRI- 08/15/2024 at [facility]- none  Intake: When did it start?: Post rotator cuff repair- Right side 03/19/2024   Conservative measures:   Gabapentin : Yes, Lyrica: No, Cymbalta: No Past Surgery: Yes C5-6 Cervical   Is this a Second Opinion: {yes/no:20286}  Main Question for Surgeon: ***  Review of Systems:  A 10 point review of systems is negative, except for the pertinent positives and negatives detailed in the HPI.  Past Medical History: Past Medical History:  Diagnosis Date   Acute meniscal tear of left knee    Anxiety    Arthritis    NECK , KNEE   Diabetes mellitus without complication (HCC)    OAB (overactive bladder)    PONV (postoperative nausea and vomiting)    Seasonal allergies    Wears glasses     Past Surgical History: Past Surgical History:  Procedure Laterality Date   ANTERIOR CERVICAL DECOMP/DISCECTOMY FUSION  09/20/2010   C4 -- 5   ANTERIOR CERVICAL DECOMP/DISCECTOMY FUSION N/A 05/17/2022   Procedure: ANTERIOR CERVICAL DECOMPRESSION/DISCECTOMY FUSION 2 LEVEL (ACDF C4-5, C6-7 AND REMOVAL OF HARDWARE);  Surgeon: Anna Aures, MD;  Location: [facility] OR;  Service: Orthopedics;  Laterality: N/A;  4 HRS 3 C-BED   APPENDECTOMY  09/20/2001   CARDIAC CATHETERIZATION  2010   pt states done at [facility] and was clear   KNEE ARTHROSCOPY Left 08/20/2014   KNEE ARTHROSCOPY Left 07/16/2015   Procedure: ARTHROSCOPY LEFT KNEE WITH MEDIAL MENISCAL DEBRIDEMENT;  Surgeon: Anna Moan, MD;  Location: [facility];  Service: Orthopedics;  Laterality: Left;    LAPAROSCOPIC CHOLECYSTECTOMY  09/21/1999   LAPAROSCOPIC SALPINGOOPHERECTOMY  09/21/2007   LIGAMENT REPAIR LEFT THUMB  09/21/2003   TUBAL LIGATION  09/21/1987   VAGINAL HYSTERECTOMY  09/21/2007    Allergies: Allergies as of 10/10/2024   (No Known Allergies)    Medications: Current Medications[1]  Social History: Social History[2]  Family Medical History: No family history on file.  Physical Examination: There were no vitals filed for this visit.  General: Patient is in no apparent distress. Attention to examination is appropriate.  Neck:   Supple.  Full range of motion.  Respiratory: Patient is breathing without any difficulty.   NEUROLOGICAL:     Awake, alert, oriented to person, place, and time.  Speech is clear and fluent.   Cranial Nerves: Pupils equal round and reactive to light.  Facial tone is symmetric. Shoulder shrug is symmetric. Tongue protrusion is midline.  There is no pronator drift.  Motor Exam:  ***  Reflexes are ***2+ and symmetric at the biceps, triceps, brachioradialis, patella and achilles.   Hoffman's is absent. Clonus is Absent  Bilateral upper and lower extremity sensation is intact to light touch ***.     Gait is normal.  ***  BRACHIAL PLEXUS NERVE AND MUSCLE EXAMINATION        Tinel's (Supra/Infra)  Date        Horners Syndrome          Hemidiaphragm          Vascular Injury  Pain  Side Involved          Examiner        NERVE ROOTS MUSCLE        Spinal accessory XI, C3, C4 Upper trapezius         XI, C3, C4 Middle trapezius         XI, C3, C4 Lower trapezius        Dorsal scapular C3,C4,C5 Levator scapulae         C4,C5 Rhomboids        Suprascapular C5,C6 Supraspinatus         C5,C6 Infraspinatus        Long thoracic C5,C6,C7 Serratus anterior        Subscapular C5,C6 Teres major         C5,C6 Subscapularis        Lateral pectoral C5,C6,C7 Pec major (clavicular)        Medial pectoral C6,C7,C8,T1 Pec major (sternal)          C6,C7,C8,T1 Pec minor        Thoracodorsal C6,C7,C8 Lattissimus dorsi        Musculocutaneous C5,C6 Biceps/Brachialis         C5,C6,C7 Coracobrachialis        Axillary C5,C6 Deltoid anterior         C5,C6 Deltoid middle         C5,C6 Deltoid posterior         C5,C6 Teres minor        Median nerve C7,C8,T1 Pronator quadratus         C6,C7 Pronator teres         C6.C7 Flexor carpi radialis         C7,C8,T1 FDP II, III         C7,C8,T1 FDS         C7,C8,T1 Palmaris longus         C7,C8,T1 FPL         C6,C7,C8,T1 FPB (Long)         C6,C7,C8,T1 Abductor PB         C8,T1 Opponens pollicis         C8,T1 Lumbricals I, II        Radial C6,C7,C8 Triceps         C5,C6 Supinator         C5,C6 Brachioradialis         C5,C6 ECRL         C6,C7,C8 ECRB         C7,C8 ECU         C7,C8 EDC         C7,C8 EDM         C7,C8 EIP         C7,C8 EPL         C6,C7 EPB         C6,C7 APL        Ulnar C7,C8,T1 FCU         C7,C8,T1 FDP III, IV         C8,T1 Abductor DM         C8,T1 Adductor pollicis         C8,T1 Opponens DM         C8,T1 1st DI         C8,T1 2nd DI         C8,T1 3rd DI         C8,T1 4th DI  C8,T1 1st PI         C8,T1 2nd PI         C8,T1 3rd PI         C8,T1 Lumbricals III, IV         C8,T1 FPB (short)        RANGE OF MOTION (A = Active, P=Passive)        Shoulder Internal Rotation         External Rotation         Abduction          Forward Flexion         Extension         Elbow Flexion          Extension         Forearm Pronation          Supination         Wrist Flexion          Extension          Radial Deviation         Ulnar Deviation          Medical Decision Making  Imaging: ***  Electrodiagnostics: ***  I have personally reviewed the images and electrodiagnostics and agree with the above interpretation.  Assessment and Plan: Anna [name] is a pleasant 59 y.o. female with ***. The symptoms are causing a significant impact on the patient's  life. I have utilized the care everywhere function in epic to review the outside records available from external health systems, and have personally reviewed relevant imaging and electrodiagnostic workup.    Thank you for involving me in the care of this patient.    Anna MICAEL Sharps MD/MSCR Neurosurgery - Peripheral Nerve Surgery       [1]  Current Outpatient Medications:    azelastine (ASTELIN) 0.1 % nasal spray, Place 1 spray into both nostrils 2 (two) times daily as needed for rhinitis. Use in each nostril as directed, Disp: , Rfl:    busPIRone  (BUSPAR ) 5 MG tablet, Take 5 mg by mouth 2 (two) times daily., Disp: , Rfl:    colestipol (COLESTID) 1 g tablet, Take 1 g by mouth See admin instructions. 2 g in the morning, 1 g in the afternoon, Disp: , Rfl:    Estradiol (VAGIFEM) 10 MCG TABS vaginal tablet, Place 10 mcg vaginally 2 (two) times a week., Disp: , Rfl:    fexofenadine (ALLEGRA) 180 MG tablet, Take 180 mg by mouth daily as needed for allergies or rhinitis., Disp: , Rfl:    fluticasone (FLONASE) 50 MCG/ACT nasal spray, Place 1 spray into both nostrils daily as needed for allergies or rhinitis., Disp: , Rfl:    LYLLANA 0.05 MG/24HR patch, Place 1 patch onto the skin 2 (two) times a week., Disp: , Rfl:    montelukast (SINGULAIR) 10 MG tablet, Take 10 mg by mouth at bedtime., Disp: , Rfl:    MYRBETRIQ  50 MG TB24 tablet, Take 50 mg by mouth daily., Disp: , Rfl:    olopatadine (PATANOL) 0.1 % ophthalmic solution, Place 1 drop into both eyes daily as needed for allergies., Disp: , Rfl:    ondansetron  (ZOFRAN ) 4 MG tablet, Take 1 tablet (4 mg total) by mouth every 8 (eight) hours as needed for nausea or vomiting., Disp: 20 tablet, Rfl: 0   Prasterone (INTRAROSA) 6.5 MG INST, Place 6.5 mg vaginally once a week., Disp: , Rfl:    Semaglutide , 1 MG/DOSE, (  OZEMPIC , 1 MG/DOSE,) 4 MG/3ML SOPN, Inject 1 mg into the skin every Friday., Disp: , Rfl:  [2]  Social History Tobacco Use   Smoking  status: Former    Current packs/day: 0.00    Average packs/day: 0.5 packs/day for 30.0 years (15.0 ttl pk-yrs)    Types: Cigarettes    Start date: 07/10/1983    Quit date: 07/09/2013    Years since quitting: 11.2   Smokeless tobacco: Never  Vaping Use   Vaping status: Never Used  Substance Use Topics   Alcohol use: Yes    Comment: occasional   Drug use: No   "

## 2024-10-02 ENCOUNTER — Encounter: Payer: Self-pay | Admitting: Family Medicine

## 2024-10-10 ENCOUNTER — Inpatient Hospital Stay
Admission: RE | Admit: 2024-10-10 | Discharge: 2024-10-10 | Disposition: A | Payer: Self-pay | Source: Ambulatory Visit | Attending: Neurosurgery | Admitting: Neurosurgery

## 2024-10-10 ENCOUNTER — Ambulatory Visit: Admitting: Neurosurgery

## 2024-10-10 ENCOUNTER — Encounter: Payer: Self-pay | Admitting: Neurosurgery

## 2024-10-10 VITALS — BP 128/76 | Ht 66.0 in | Wt 188.5 lb

## 2024-10-10 DIAGNOSIS — Z049 Encounter for examination and observation for unspecified reason: Secondary | ICD-10-CM

## 2024-10-10 DIAGNOSIS — G540 Brachial plexus disorders: Secondary | ICD-10-CM | POA: Diagnosis not present

## 2024-10-10 DIAGNOSIS — M79601 Pain in right arm: Secondary | ICD-10-CM | POA: Diagnosis not present

## 2024-10-10 DIAGNOSIS — M1990 Unspecified osteoarthritis, unspecified site: Secondary | ICD-10-CM | POA: Insufficient documentation

## 2024-10-10 DIAGNOSIS — Z9889 Other specified postprocedural states: Secondary | ICD-10-CM

## 2024-10-10 DIAGNOSIS — M7501 Adhesive capsulitis of right shoulder: Secondary | ICD-10-CM | POA: Diagnosis not present

## 2024-10-11 DIAGNOSIS — G540 Brachial plexus disorders: Secondary | ICD-10-CM | POA: Insufficient documentation

## 2024-10-11 DIAGNOSIS — M79601 Pain in right arm: Secondary | ICD-10-CM | POA: Insufficient documentation

## 2024-10-11 DIAGNOSIS — Z049 Encounter for examination and observation for unspecified reason: Secondary | ICD-10-CM | POA: Insufficient documentation

## 2024-10-11 DIAGNOSIS — M7501 Adhesive capsulitis of right shoulder: Secondary | ICD-10-CM | POA: Insufficient documentation
